# Patient Record
Sex: Female | Born: 1970 | Race: Black or African American | Hispanic: No | Marital: Single | Smoking: Never smoker
Health system: Southern US, Community
[De-identification: ages and names within clinical notes are randomized; demographics above are authoritative.]

## PROBLEM LIST (undated history)

## (undated) DIAGNOSIS — I10 Essential (primary) hypertension: Secondary | ICD-10-CM

## (undated) HISTORY — PX: HERNIA REPAIR: SHX51

## (undated) HISTORY — PX: REDUCTION MAMMAPLASTY: SUR839

## (undated) HISTORY — PX: ABDOMINAL HYSTERECTOMY: SHX81

## (undated) HISTORY — PX: BREAST REDUCTION SURGERY: SHX8

## (undated) HISTORY — PX: UTERINE FIBROID SURGERY: SHX826

---

## 1998-01-21 ENCOUNTER — Emergency Department (HOSPITAL_COMMUNITY): Admission: EM | Admit: 1998-01-21 | Discharge: 1998-01-21 | Payer: Self-pay | Admitting: Emergency Medicine

## 2013-02-19 ENCOUNTER — Ambulatory Visit: Payer: Self-pay | Admitting: Family Medicine

## 2014-10-05 ENCOUNTER — Other Ambulatory Visit: Payer: Self-pay

## 2014-10-05 DIAGNOSIS — Z1231 Encounter for screening mammogram for malignant neoplasm of breast: Secondary | ICD-10-CM

## 2014-10-14 ENCOUNTER — Ambulatory Visit
Admission: RE | Admit: 2014-10-14 | Discharge: 2014-10-14 | Disposition: A | Discharge status: Home / Self Care | Payer: BLUE CROSS/BLUE SHIELD | Source: Ambulatory Visit

## 2014-10-14 ENCOUNTER — Encounter (INDEPENDENT_AMBULATORY_CARE_PROVIDER_SITE_OTHER): Payer: Self-pay

## 2014-10-14 DIAGNOSIS — Z1231 Encounter for screening mammogram for malignant neoplasm of breast: Secondary | ICD-10-CM

## 2014-12-24 ENCOUNTER — Other Ambulatory Visit (HOSPITAL_COMMUNITY): Payer: BLUE CROSS/BLUE SHIELD

## 2014-12-25 ENCOUNTER — Other Ambulatory Visit: Payer: Self-pay | Admitting: Physician Assistant

## 2014-12-25 ENCOUNTER — Encounter (HOSPITAL_COMMUNITY): Payer: Self-pay

## 2014-12-25 ENCOUNTER — Encounter (HOSPITAL_COMMUNITY)
Admission: RE | Admit: 2014-12-25 | Discharge: 2014-12-25 | Disposition: A | Discharge status: Home / Self Care | Payer: BLUE CROSS/BLUE SHIELD | Source: Ambulatory Visit | Attending: Orthopedic Surgery | Admitting: Orthopedic Surgery

## 2014-12-25 DIAGNOSIS — M179 Osteoarthritis of knee, unspecified: Secondary | ICD-10-CM | POA: Insufficient documentation

## 2014-12-25 DIAGNOSIS — Z01810 Encounter for preprocedural cardiovascular examination: Secondary | ICD-10-CM | POA: Insufficient documentation

## 2014-12-25 DIAGNOSIS — Z0183 Encounter for blood typing: Secondary | ICD-10-CM | POA: Diagnosis not present

## 2014-12-25 DIAGNOSIS — Z01812 Encounter for preprocedural laboratory examination: Secondary | ICD-10-CM | POA: Diagnosis present

## 2014-12-25 HISTORY — DX: Essential (primary) hypertension: I10

## 2014-12-25 LAB — CBC WITH DIFFERENTIAL/PLATELET
BASOS ABS: 0.1 10*3/uL (ref 0.0–0.1)
BASOS PCT: 1 % (ref 0–1)
Eosinophils Absolute: 0.1 10*3/uL (ref 0.0–0.7)
Eosinophils Relative: 1 % (ref 0–5)
HCT: 40.7 % (ref 36.0–46.0)
Hemoglobin: 13.4 g/dL (ref 12.0–15.0)
LYMPHS PCT: 37 % (ref 12–46)
Lymphs Abs: 3.6 10*3/uL (ref 0.7–4.0)
MCH: 27.6 pg (ref 26.0–34.0)
MCHC: 32.9 g/dL (ref 30.0–36.0)
MCV: 83.7 fL (ref 78.0–100.0)
MONOS PCT: 8 % (ref 3–12)
Monocytes Absolute: 0.7 10*3/uL (ref 0.1–1.0)
NEUTROS ABS: 5.2 10*3/uL (ref 1.7–7.7)
NEUTROS PCT: 53 % (ref 43–77)
PLATELETS: 282 10*3/uL (ref 150–400)
RBC: 4.86 MIL/uL (ref 3.87–5.11)
RDW: 13.9 % (ref 11.5–15.5)
WBC: 9.7 10*3/uL (ref 4.0–10.5)

## 2014-12-25 LAB — COMPREHENSIVE METABOLIC PANEL
ALT: 15 U/L (ref 14–54)
ANION GAP: 6 (ref 5–15)
AST: 18 U/L (ref 15–41)
Albumin: 3.9 g/dL (ref 3.5–5.0)
Alkaline Phosphatase: 90 U/L (ref 38–126)
BUN: 12 mg/dL (ref 6–20)
CHLORIDE: 109 mmol/L (ref 101–111)
CO2: 25 mmol/L (ref 22–32)
Calcium: 9.5 mg/dL (ref 8.9–10.3)
Creatinine, Ser: 0.81 mg/dL (ref 0.44–1.00)
GFR calc Af Amer: >60 mL/min (ref >60)
Glucose, Bld: 92 mg/dL (ref 65–99)
POTASSIUM: 3.7 mmol/L (ref 3.5–5.1)
SODIUM: 140 mmol/L (ref 135–145)
Total Bilirubin: 0.2 mg/dL — ABNORMAL LOW (ref 0.3–1.2)
Total Protein: 7 g/dL (ref 6.5–8.1)

## 2014-12-25 LAB — ABO/RH: ABO/RH(D): O POS

## 2014-12-25 LAB — PROTIME-INR
INR: 1.12 (ref 0.00–1.49)
PROTHROMBIN TIME: 14.6 s (ref 11.6–15.2)

## 2014-12-25 LAB — TYPE AND SCREEN
ABO/RH(D): O POS
ANTIBODY SCREEN: NEGATIVE

## 2014-12-25 LAB — SURGICAL PCR SCREEN
MRSA, PCR: NEGATIVE
STAPHYLOCOCCUS AUREUS: NEGATIVE

## 2014-12-25 LAB — APTT: APTT: 33 s (ref 24–37)

## 2014-12-25 NOTE — Progress Notes (Signed)
PCP is [employer] [facility]. Patient denied having any acute cardiac or pulmonary issues. Patient informed Nurse that she had hypertension before she had a abdominal hysterectomy, but has not had any issues with BP thereafter.

## 2014-12-25 NOTE — Pre-Procedure Instructions (Signed)
Katherine Harnessiffany Y Gilmore  12/25/2014    Your procedure is scheduled on: Wednesday January 06, 2015 at 10:00 AM.  [facility] at 8:00 A.M.  Call this number if you have problems the morning of surgery: [phone]    Remember:  Do not eat food or drink liquids after midnight.  Take these medicines the morning of surgery with A SIP OF WATER : NONE   Please stop taking any vitamins, herbal medications, Ibuprofen, Advil, Motrin, etc on Wednesday July 13th   Do not wear jewelry, make-up or nail polish.  Do not wear lotions, powders, or perfumes.   Do not shave 48 hours prior to surgery.    Do not bring valuables to the hospital.  [facility] is not responsible for any belongings or valuables.  Contacts, dentures or bridgework may not be worn into surgery.  Leave your suitcase in the car.  After surgery it may be brought to your room.  For patients admitted to the hospital, discharge time will be determined by your treatment team.  Patients discharged the day of surgery will not be allowed to drive home.   Name and phone number of your driver:    Special instructions:  Shower using CHG soap the night before and the morning of your surgery  Please read over the following fact sheets that you were given. Pain Booklet, Coughing and Deep Breathing, Total Joint Packet, MRSA Information and Surgical Site Infection Prevention

## 2014-12-25 NOTE — H&P (Signed)
TOTAL KNEE ADMISSION H&P  Patient is being admitted for left total knee arthroplasty.  Subjective:  Chief Complaint:left knee pain.  HPI: Katherine ARRAZOLA, 44 y.o. female, has a history of pain and functional disability in the left knee due to arthritis and has failed non-surgical conservative treatments for greater than 12 weeks to includeNSAID's and/or analgesics, corticosteriod injections, viscosupplementation injections and activity modification.  Onset of symptoms was gradual, starting 3 years ago with rapidlly worsening course since that time. The patient noted no past surgery on the left knee(s).  Patient currently rates pain in the left knee(s) at 7 out of 10 with activity. Patient has night pain, worsening of pain with activity and weight bearing, pain that interferes with activities of daily living and crepitus.  Patient has evidence of subchondral sclerosis and joint space narrowing by imaging studies. There is no active infection.  There are no active problems to display for this patient.  No past medical history on file.  No past surgical history on file.   (Not in a hospital admission) Allergies not on file  History  Substance Use Topics  . Smoking status: Not on file  . Smokeless tobacco: Not on file  . Alcohol Use: Not on file    No family history on file.   Review of Systems  Constitutional: Negative.   HENT: Negative.   Eyes: Negative.   Respiratory: Negative.   Cardiovascular: Negative.   Gastrointestinal: Negative.   Genitourinary: Negative.   Musculoskeletal: Positive for joint pain.  Skin: Negative.   Neurological: Negative.   Endo/Heme/Allergies: Negative.   Psychiatric/Behavioral: Negative.     Objective:  Physical Exam  Constitutional: She is oriented to person, place, and time. She appears well-developed and well-nourished.  HENT:  Head: Normocephalic and atraumatic.  Eyes: EOM are normal. Pupils are equal, round, and reactive to light.  Neck:  Normal range of motion. Neck supple.  Cardiovascular: Normal rate and regular rhythm.  Exam reveals no gallop and no friction rub.   No murmur heard. Respiratory: Effort normal and breath sounds normal. No respiratory distress. She has no wheezes. She has no rales.  GI: Soft. Bowel sounds are normal.  Musculoskeletal:  Examination of patient's bilateral knees reveal significant lateral joint line tenderness on both the left and right knee, worse on the left.  1+ effusion bilaterally.  Severe knee valgus with standing.  Severe foot pronation.  Retropatellar grind is 2+ bilaterally.  Ligamentous structures are intact.  Negative McMurray's and Thessaly bilaterally.  Patient's gait is significantly abnormal with significant pronation, valgus stress on the knees, and internal rotation of the hips.  Neurovascularly intact distally.   Neurological: She is alert and oriented to person, place, and time.  Skin: Skin is warm and dry.  Psychiatric: She has a normal mood and affect. Her behavior is normal. Judgment and thought content normal.    Vital signs in last 24 hours: @  Labs:   There is no height or weight on file to calculate BMI.   Imaging Review Plain radiographs demonstrate severe degenerative joint disease of the left knee(s). The overall alignment ismild valgus. The bone quality appears to be fair for age and reported activity level.  Assessment/Plan:  End stage arthritis, left knee   The patient history, physical examination, clinical judgment of the provider and imaging studies are consistent with end stage degenerative joint disease of the left knee(s) and total knee arthroplasty is deemed medically necessary. The treatment options including medical management, injection therapy  arthroscopy and arthroplasty were discussed at length. The risks and benefits of total knee arthroplasty were presented and reviewed. The risks due to aseptic loosening, infection, stiffness, patella  tracking problems, thromboembolic complications and other imponderables were discussed. The patient acknowledged the explanation, agreed to proceed with the plan and consent was signed. Patient is being admitted for inpatient treatment for surgery, pain control, PT, OT, prophylactic antibiotics, VTE prophylaxis, progressive ambulation and ADL's and discharge planning. The patient is planning to be discharged home with home health services

## 2014-12-27 LAB — URINE CULTURE

## 2015-01-05 MED ORDER — CEFAZOLIN SODIUM-DEXTROSE 2-3 GM-% IV SOLR
2.0000 g | INTRAVENOUS | Status: AC
  Administered 2015-01-06: 2 g via INTRAVENOUS
  Filled 2015-01-05: qty 50

## 2015-01-05 MED ORDER — LACTATED RINGERS IV SOLN: INTRAVENOUS | Status: DC

## 2015-01-05 NOTE — Anesthesia Preprocedure Evaluation (Addendum)
Anesthesia Evaluation  Patient identified by MRN, date of birth, ID band Patient awake    Reviewed: Allergy & Precautions, NPO status , Patient's Chart, lab work & pertinent test results, reviewed documented beta blocker date and time   Airway Mallampati: I  TM Distance: >3 FB Neck ROM: Full    Dental  (+) Teeth Intact, Dental Advisory Given   Pulmonary neg pulmonary ROS,  breath sounds clear to auscultation        Cardiovascular hypertension, Pt. on medications Rhythm:Regular  EKG 12/2014 WNL   Neuro/Psych negative neurological ROS     GI/Hepatic negative GI ROS, Neg liver ROS,   Endo/Other  negative endocrine ROS  Renal/GU negative Renal ROS     Musculoskeletal  (+) Arthritis -,   Abdominal (+)  Abdomen: soft. Bowel sounds: normal.  Peds  Hematology 13/40   Anesthesia Other Findings   Reproductive/Obstetrics                       Anesthesia Physical Anesthesia Plan  ASA: II  Anesthesia Plan: General   Post-op Pain Management:    Induction: Intravenous  Airway Management Planned: Oral ETT  Additional Equipment:   Intra-op Plan:   Post-operative Plan: Extubation in OR  Informed Consent: I have reviewed the patients History and Physical, chart, labs and discussed the procedure including the risks, benefits and alternatives for the proposed anesthesia with the patient or authorized representative who has indicated his/her understanding and acceptance.     Plan Discussed with:   Anesthesia Plan Comments: (Will offer spinal, GA if spinal refused)      Anesthesia Quick Evaluation

## 2015-01-06 ENCOUNTER — Inpatient Hospital Stay (HOSPITAL_COMMUNITY): Payer: BLUE CROSS/BLUE SHIELD | Admitting: Anesthesiology

## 2015-01-06 ENCOUNTER — Encounter (HOSPITAL_COMMUNITY)
Admission: RE | Disposition: A | Discharge status: Home Health | Payer: Self-pay | Source: Ambulatory Visit | Attending: Orthopedic Surgery

## 2015-01-06 ENCOUNTER — Inpatient Hospital Stay (HOSPITAL_COMMUNITY)
Admission: RE | Admit: 2015-01-06 | Discharge: 2015-01-07 | DRG: 470 | Disposition: A | Discharge status: Home Health | Payer: BLUE CROSS/BLUE SHIELD | Source: Ambulatory Visit | Attending: Orthopedic Surgery | Admitting: Orthopedic Surgery

## 2015-01-06 ENCOUNTER — Encounter (HOSPITAL_COMMUNITY): Payer: Self-pay | Admitting: *Deleted

## 2015-01-06 ENCOUNTER — Inpatient Hospital Stay (HOSPITAL_COMMUNITY): Payer: BLUE CROSS/BLUE SHIELD

## 2015-01-06 DIAGNOSIS — Z96659 Presence of unspecified artificial knee joint: Secondary | ICD-10-CM

## 2015-01-06 DIAGNOSIS — M1712 Unilateral primary osteoarthritis, left knee: Secondary | ICD-10-CM | POA: Diagnosis present

## 2015-01-06 DIAGNOSIS — I10 Essential (primary) hypertension: Secondary | ICD-10-CM | POA: Diagnosis present

## 2015-01-06 DIAGNOSIS — M179 Osteoarthritis of knee, unspecified: Secondary | ICD-10-CM | POA: Diagnosis present

## 2015-01-06 DIAGNOSIS — M1710 Unilateral primary osteoarthritis, unspecified knee: Secondary | ICD-10-CM | POA: Diagnosis present

## 2015-01-06 DIAGNOSIS — M25562 Pain in left knee: Secondary | ICD-10-CM | POA: Diagnosis present

## 2015-01-06 HISTORY — PX: TOTAL KNEE ARTHROPLASTY: SHX125

## 2015-01-06 SURGERY — ARTHROPLASTY, KNEE, TOTAL
Anesthesia: General | Site: Knee | Laterality: Left

## 2015-01-06 MED ORDER — CHLORHEXIDINE GLUCONATE 4 % EX LIQD
60.0000 mL | Freq: Once | CUTANEOUS | Status: DC
Start: 2015-01-06 — End: 2015-01-06

## 2015-01-06 MED ORDER — PHENOL 1.4 % MT LIQD: 1.0000 | OROMUCOSAL | Status: DC | PRN

## 2015-01-06 MED ORDER — ACETAMINOPHEN 650 MG RE SUPP: 650.0000 mg | Freq: Four times a day (QID) | RECTAL | Status: DC | PRN

## 2015-01-06 MED ORDER — BUPIVACAINE LIPOSOME 1.3 % IJ SUSP
20.0000 mL | INTRAMUSCULAR | Status: AC
  Administered 2015-01-06: 20 mL
  Filled 2015-01-06: qty 20

## 2015-01-06 MED ORDER — METHOCARBAMOL 500 MG PO TABS: 500.0000 mg | ORAL_TABLET | Freq: Four times a day (QID) | ORAL | Status: DC

## 2015-01-06 MED ORDER — FENTANYL CITRATE (PF) 250 MCG/5ML IJ SOLN
INTRAMUSCULAR | Status: AC
  Filled 2015-01-06: qty 5

## 2015-01-06 MED ORDER — SODIUM CHLORIDE 0.9 % IR SOLN
Status: DC | PRN
  Administered 2015-01-06 (×2): 1000 mL

## 2015-01-06 MED ORDER — ACETAMINOPHEN 325 MG PO TABS: 650.0000 mg | ORAL_TABLET | Freq: Four times a day (QID) | ORAL | Status: DC | PRN

## 2015-01-06 MED ORDER — MAGNESIUM OXIDE 400 (241.3 MG) MG PO TABS
400.0000 mg | ORAL_TABLET | Freq: Every day | ORAL | Status: DC
  Administered 2015-01-06: 400 mg via ORAL
  Filled 2015-01-06: qty 1

## 2015-01-06 MED ORDER — ACETAMINOPHEN 10 MG/ML IV SOLN
INTRAVENOUS | Status: AC
  Filled 2015-01-06: qty 100

## 2015-01-06 MED ORDER — LIDOCAINE HCL (CARDIAC) 20 MG/ML IV SOLN
INTRAVENOUS | Status: DC | PRN
  Administered 2015-01-06: 80 mg via INTRAVENOUS

## 2015-01-06 MED ORDER — ACETAMINOPHEN 10 MG/ML IV SOLN
INTRAVENOUS | Status: DC | PRN
  Administered 2015-01-06: 1000 mg via INTRAVENOUS

## 2015-01-06 MED ORDER — GLYCOPYRROLATE 0.2 MG/ML IJ SOLN
INTRAMUSCULAR | Status: DC | PRN
  Administered 2015-01-06: 0.6 mg via INTRAVENOUS

## 2015-01-06 MED ORDER — ONDANSETRON HCL 4 MG/2ML IJ SOLN
INTRAMUSCULAR | Status: DC | PRN
  Administered 2015-01-06: 4 mg via INTRAVENOUS

## 2015-01-06 MED ORDER — ROCURONIUM BROMIDE 100 MG/10ML IV SOLN
INTRAVENOUS | Status: DC | PRN
  Administered 2015-01-06: 10 mg via INTRAVENOUS
  Administered 2015-01-06: 40 mg via INTRAVENOUS

## 2015-01-06 MED ORDER — PROMETHAZINE HCL 25 MG/ML IJ SOLN: 6.2500 mg | INTRAMUSCULAR | Status: DC | PRN

## 2015-01-06 MED ORDER — HYDROMORPHONE HCL 1 MG/ML IJ SOLN
INTRAMUSCULAR | Status: AC
  Filled 2015-01-06: qty 1

## 2015-01-06 MED ORDER — HYDROMORPHONE HCL 1 MG/ML IJ SOLN
0.2500 mg | INTRAMUSCULAR | Status: DC | PRN
  Administered 2015-01-06 (×4): 0.5 mg via INTRAVENOUS

## 2015-01-06 MED ORDER — OXYCODONE HCL 5 MG PO TABS
5.0000 mg | ORAL_TABLET | ORAL | Status: DC | PRN
  Administered 2015-01-06 (×2): 10 mg via ORAL
  Administered 2015-01-06: 5 mg via ORAL
  Administered 2015-01-07 (×3): 10 mg via ORAL
  Filled 2015-01-06 (×5): qty 2

## 2015-01-06 MED ORDER — CEFAZOLIN SODIUM-DEXTROSE 2-3 GM-% IV SOLR
2.0000 g | Freq: Four times a day (QID) | INTRAVENOUS | Status: AC
  Administered 2015-01-06 (×2): 2 g via INTRAVENOUS
  Filled 2015-01-06 (×2): qty 50

## 2015-01-06 MED ORDER — APIXABAN 2.5 MG PO TABS
2.5000 mg | ORAL_TABLET | Freq: Two times a day (BID) | ORAL | Status: DC
  Administered 2015-01-07: 2.5 mg via ORAL
  Filled 2015-01-06: qty 1

## 2015-01-06 MED ORDER — ONDANSETRON HCL 4 MG PO TABS: 4.0000 mg | ORAL_TABLET | Freq: Four times a day (QID) | ORAL | Status: DC | PRN

## 2015-01-06 MED ORDER — ROCURONIUM BROMIDE 50 MG/5ML IV SOLN
INTRAVENOUS | Status: AC
  Filled 2015-01-06: qty 1

## 2015-01-06 MED ORDER — LACTATED RINGERS IV SOLN
INTRAVENOUS | Status: DC
  Administered 2015-01-06 (×3): via INTRAVENOUS

## 2015-01-06 MED ORDER — ONDANSETRON HCL 4 MG/2ML IJ SOLN
4.0000 mg | Freq: Four times a day (QID) | INTRAMUSCULAR | Status: DC | PRN
  Administered 2015-01-06: 4 mg via INTRAVENOUS
  Filled 2015-01-06: qty 2

## 2015-01-06 MED ORDER — APIXABAN 2.5 MG PO TABS
ORAL_TABLET | ORAL | Status: DC
Start: 2015-01-06 — End: 2015-03-10

## 2015-01-06 MED ORDER — FENTANYL CITRATE (PF) 100 MCG/2ML IJ SOLN
INTRAMUSCULAR | Status: DC | PRN
  Administered 2015-01-06: 50 ug via INTRAVENOUS
  Administered 2015-01-06: 100 ug via INTRAVENOUS
  Administered 2015-01-06 (×6): 50 ug via INTRAVENOUS

## 2015-01-06 MED ORDER — ALUM & MAG HYDROXIDE-SIMETH 200-200-20 MG/5ML PO SUSP: 30.0000 mL | ORAL | Status: DC | PRN

## 2015-01-06 MED ORDER — MIDAZOLAM HCL 5 MG/5ML IJ SOLN
INTRAMUSCULAR | Status: DC | PRN
  Administered 2015-01-06 (×2): 1 mg via INTRAVENOUS

## 2015-01-06 MED ORDER — BISACODYL 5 MG PO TBEC: 5.0000 mg | DELAYED_RELEASE_TABLET | Freq: Every day | ORAL | Status: DC | PRN

## 2015-01-06 MED ORDER — MIDAZOLAM HCL 2 MG/2ML IJ SOLN
INTRAMUSCULAR | Status: AC
  Filled 2015-01-06: qty 2

## 2015-01-06 MED ORDER — HYDROMORPHONE HCL 1 MG/ML IJ SOLN
0.5000 mg | INTRAMUSCULAR | Status: DC | PRN
  Administered 2015-01-06 – 2015-01-07 (×3): 1 mg via INTRAVENOUS
  Filled 2015-01-06 (×4): qty 1

## 2015-01-06 MED ORDER — CELECOXIB 200 MG PO CAPS
200.0000 mg | ORAL_CAPSULE | Freq: Two times a day (BID) | ORAL | Status: DC
  Administered 2015-01-06 – 2015-01-07 (×2): 200 mg via ORAL
  Filled 2015-01-06 (×2): qty 1

## 2015-01-06 MED ORDER — MAGNESIUM 500 MG PO TABS: 500.0000 mg | ORAL_TABLET | Freq: Every day | ORAL | Status: DC

## 2015-01-06 MED ORDER — PROPOFOL 10 MG/ML IV BOLUS
INTRAVENOUS | Status: DC | PRN
  Administered 2015-01-06: 200 mg via INTRAVENOUS

## 2015-01-06 MED ORDER — OXYCODONE-ACETAMINOPHEN 5-325 MG PO TABS: 1.0000 | ORAL_TABLET | ORAL | Status: DC | PRN

## 2015-01-06 MED ORDER — MAGNESIUM CITRATE PO SOLN: 1.0000 | Freq: Once | ORAL | Status: AC | PRN

## 2015-01-06 MED ORDER — DEXAMETHASONE SODIUM PHOSPHATE 10 MG/ML IJ SOLN
INTRAMUSCULAR | Status: DC | PRN
  Administered 2015-01-06: 8 mg via INTRAVENOUS

## 2015-01-06 MED ORDER — MENTHOL 3 MG MT LOZG: 1.0000 | LOZENGE | OROMUCOSAL | Status: DC | PRN

## 2015-01-06 MED ORDER — BUPIVACAINE HCL 0.5 % IJ SOLN
INTRAMUSCULAR | Status: DC | PRN
  Administered 2015-01-06: 20 mL

## 2015-01-06 MED ORDER — 0.9 % SODIUM CHLORIDE (POUR BTL) OPTIME
TOPICAL | Status: DC | PRN
  Administered 2015-01-06: 1000 mL

## 2015-01-06 MED ORDER — CHLORHEXIDINE GLUCONATE 4 % EX LIQD: 60.0000 mL | Freq: Once | CUTANEOUS | Status: DC

## 2015-01-06 MED ORDER — NEOSTIGMINE METHYLSULFATE 10 MG/10ML IV SOLN
INTRAVENOUS | Status: DC | PRN
  Administered 2015-01-06: 4 mg via INTRAVENOUS

## 2015-01-06 MED ORDER — DOCUSATE SODIUM 100 MG PO CAPS
100.0000 mg | ORAL_CAPSULE | Freq: Two times a day (BID) | ORAL | Status: DC
Start: 2015-01-06 — End: 2015-01-07
  Administered 2015-01-06 – 2015-01-07 (×2): 100 mg via ORAL
  Filled 2015-01-06 (×2): qty 1

## 2015-01-06 MED ORDER — METOCLOPRAMIDE HCL 5 MG PO TABS: 5.0000 mg | ORAL_TABLET | Freq: Three times a day (TID) | ORAL | Status: DC | PRN

## 2015-01-06 MED ORDER — DIPHENHYDRAMINE HCL 12.5 MG/5ML PO ELIX: 12.5000 mg | ORAL_SOLUTION | ORAL | Status: DC | PRN

## 2015-01-06 MED ORDER — METHOCARBAMOL 1000 MG/10ML IJ SOLN
500.0000 mg | Freq: Four times a day (QID) | INTRAMUSCULAR | Status: DC | PRN
  Administered 2015-01-06: 500 mg via INTRAVENOUS
  Filled 2015-01-06 (×4): qty 5

## 2015-01-06 MED ORDER — PHENYLEPHRINE 40 MCG/ML (10ML) SYRINGE FOR IV PUSH (FOR BLOOD PRESSURE SUPPORT)
PREFILLED_SYRINGE | INTRAVENOUS | Status: AC
  Filled 2015-01-06: qty 10

## 2015-01-06 MED ORDER — LIDOCAINE HCL (CARDIAC) 20 MG/ML IV SOLN
INTRAVENOUS | Status: AC
  Filled 2015-01-06: qty 5

## 2015-01-06 MED ORDER — MEPERIDINE HCL 25 MG/ML IJ SOLN: 6.2500 mg | INTRAMUSCULAR | Status: DC | PRN

## 2015-01-06 MED ORDER — DEXAMETHASONE SODIUM PHOSPHATE 10 MG/ML IJ SOLN
10.0000 mg | Freq: Once | INTRAMUSCULAR | Status: AC
Start: 2015-01-07 — End: 2015-01-07
  Administered 2015-01-07: 10 mg via INTRAVENOUS
  Filled 2015-01-06: qty 1

## 2015-01-06 MED ORDER — METOCLOPRAMIDE HCL 5 MG/ML IJ SOLN: 5.0000 mg | Freq: Three times a day (TID) | INTRAMUSCULAR | Status: DC | PRN

## 2015-01-06 MED ORDER — PROPOFOL 10 MG/ML IV BOLUS
INTRAVENOUS | Status: AC
  Filled 2015-01-06: qty 20

## 2015-01-06 MED ORDER — OXYCODONE HCL 5 MG PO TABS
ORAL_TABLET | ORAL | Status: AC
  Filled 2015-01-06: qty 1

## 2015-01-06 MED ORDER — ONDANSETRON HCL 4 MG PO TABS: 4.0000 mg | ORAL_TABLET | Freq: Three times a day (TID) | ORAL | Status: DC | PRN

## 2015-01-06 MED ORDER — SENNOSIDES-DOCUSATE SODIUM 8.6-50 MG PO TABS
1.0000 | ORAL_TABLET | Freq: Every evening | ORAL | Status: DC | PRN
Start: 2015-01-06 — End: 2015-01-07

## 2015-01-06 MED ORDER — METHOCARBAMOL 500 MG PO TABS
500.0000 mg | ORAL_TABLET | Freq: Four times a day (QID) | ORAL | Status: DC | PRN
  Administered 2015-01-06 – 2015-01-07 (×3): 500 mg via ORAL
  Filled 2015-01-06 (×4): qty 1

## 2015-01-06 MED ORDER — POTASSIUM CHLORIDE IN NACL 20-0.9 MEQ/L-% IV SOLN
INTRAVENOUS | Status: DC
Start: 2015-01-06 — End: 2015-01-07
  Administered 2015-01-06: 17:00:00 via INTRAVENOUS
  Filled 2015-01-06 (×3): qty 1000

## 2015-01-06 SURGICAL SUPPLY — 63 items
BANDAGE ELASTIC 4 VELCRO ST LF (GAUZE/BANDAGES/DRESSINGS) ×3 IMPLANT
BANDAGE ELASTIC 6 VELCRO ST LF (GAUZE/BANDAGES/DRESSINGS) ×3 IMPLANT
BANDAGE ESMARK 6X9 LF (GAUZE/BANDAGES/DRESSINGS) ×1 IMPLANT
BENZOIN TINCTURE PRP APPL 2/3 (GAUZE/BANDAGES/DRESSINGS) ×3 IMPLANT
BLADE SAG 18X100X1.27 (BLADE) ×6 IMPLANT
BNDG ESMARK 6X9 LF (GAUZE/BANDAGES/DRESSINGS) ×3
BOWL SMART MIX CTS (DISPOSABLE) ×3 IMPLANT
CAPT KNEE TOTAL 3 ×3 IMPLANT
CEMENT BONE SIMPLEX SPEEDSET (Cement) ×6 IMPLANT
CLOSURE WOUND 1/2 X4 (GAUZE/BANDAGES/DRESSINGS) ×1
COVER SURGICAL LIGHT HANDLE (MISCELLANEOUS) ×3 IMPLANT
CUFF TOURNIQUET SINGLE 34IN LL (TOURNIQUET CUFF) IMPLANT
CUFF TOURNIQUET SINGLE 44IN (TOURNIQUET CUFF) ×3 IMPLANT
DRAPE EXTREMITY T 121X128X90 (DRAPE) ×3 IMPLANT
DRAPE IMP U-DRAPE 54X76 (DRAPES) ×3 IMPLANT
DRAPE PROXIMA HALF (DRAPES) ×3 IMPLANT
DRAPE U-SHAPE 47X51 STRL (DRAPES) ×3 IMPLANT
DRSG PAD ABDOMINAL 8X10 ST (GAUZE/BANDAGES/DRESSINGS) ×3 IMPLANT
DURAPREP 26ML APPLICATOR (WOUND CARE) ×6 IMPLANT
ELECT CAUTERY BLADE 6.4 (BLADE) ×3 IMPLANT
ELECT REM PT RETURN 9FT ADLT (ELECTROSURGICAL) ×3
ELECTRODE REM PT RTRN 9FT ADLT (ELECTROSURGICAL) ×1 IMPLANT
EVACUATOR 1/8 PVC DRAIN (DRAIN) ×3 IMPLANT
FACESHIELD WRAPAROUND (MASK) ×6 IMPLANT
GAUZE SPONGE 4X4 12PLY STRL (GAUZE/BANDAGES/DRESSINGS) ×3 IMPLANT
GLOVE BIOGEL PI IND STRL 7.0 (GLOVE) ×1 IMPLANT
GLOVE BIOGEL PI INDICATOR 7.0 (GLOVE) ×2
GLOVE ORTHO TXT STRL SZ7.5 (GLOVE) ×3 IMPLANT
GLOVE SURG ORTHO 7.0 STRL STRW (GLOVE) ×3 IMPLANT
GOWN STRL REUS W/ TWL LRG LVL3 (GOWN DISPOSABLE) ×3 IMPLANT
GOWN STRL REUS W/ TWL XL LVL3 (GOWN DISPOSABLE) ×1 IMPLANT
GOWN STRL REUS W/TWL LRG LVL3 (GOWN DISPOSABLE) ×6
GOWN STRL REUS W/TWL XL LVL3 (GOWN DISPOSABLE) ×2
HANDPIECE INTERPULSE COAX TIP (DISPOSABLE) ×2
IMMOBILIZER KNEE 22 UNIV (SOFTGOODS) ×3 IMPLANT
IMMOBILIZER KNEE 24 THIGH 36 (MISCELLANEOUS) IMPLANT
IMMOBILIZER KNEE 24 UNIV (MISCELLANEOUS)
KIT BASIN OR (CUSTOM PROCEDURE TRAY) ×3 IMPLANT
KIT ROOM TURNOVER OR (KITS) ×3 IMPLANT
MANIFOLD NEPTUNE II (INSTRUMENTS) ×3 IMPLANT
NEEDLE 18GX1X1/2 (RX/OR ONLY) (NEEDLE) ×3 IMPLANT
NEEDLE HYPO 25GX1X1/2 BEV (NEEDLE) ×3 IMPLANT
NS IRRIG 1000ML POUR BTL (IV SOLUTION) ×3 IMPLANT
PACK TOTAL JOINT (CUSTOM PROCEDURE TRAY) ×3 IMPLANT
PACK UNIVERSAL I (CUSTOM PROCEDURE TRAY) ×3 IMPLANT
PAD ARMBOARD 7.5X6 YLW CONV (MISCELLANEOUS) ×6 IMPLANT
PAD CAST 4YDX4 CTTN HI CHSV (CAST SUPPLIES) ×1 IMPLANT
PADDING CAST COTTON 4X4 STRL (CAST SUPPLIES) ×2
PADDING CAST COTTON 6X4 STRL (CAST SUPPLIES) IMPLANT
SET HNDPC FAN SPRY TIP SCT (DISPOSABLE) ×1 IMPLANT
STRIP CLOSURE SKIN 1/2X4 (GAUZE/BANDAGES/DRESSINGS) ×2 IMPLANT
SUCTION FRAZIER TIP 10 FR DISP (SUCTIONS) ×3 IMPLANT
SUT MNCRL AB 4-0 PS2 18 (SUTURE) ×3 IMPLANT
SUT VIC AB 0 CT1 27 (SUTURE) ×4
SUT VIC AB 0 CT1 27XBRD ANBCTR (SUTURE) ×2 IMPLANT
SUT VIC AB 1 CTX 36 (SUTURE) ×4
SUT VIC AB 1 CTX36XBRD ANBCTR (SUTURE) ×2 IMPLANT
SUT VIC AB 2-0 CT1 27 (SUTURE) ×6
SUT VIC AB 2-0 CT1 TAPERPNT 27 (SUTURE) ×3 IMPLANT
SYR 50ML LL SCALE MARK (SYRINGE) ×3 IMPLANT
SYR CONTROL 10ML LL (SYRINGE) ×3 IMPLANT
TOWEL OR 17X24 6PK STRL BLUE (TOWEL DISPOSABLE) ×3 IMPLANT
TOWEL OR 17X26 10 PK STRL BLUE (TOWEL DISPOSABLE) ×3 IMPLANT

## 2015-01-06 NOTE — Transfer of Care (Signed)
Immediate Anesthesia Transfer of Care Note  Patient: Katherine Gilmore  Procedure(s) Performed: Procedure(s): TOTAL KNEE ARTHROPLASTY (Left)  Patient Location: PACU  Anesthesia Type:General  Level of Consciousness: awake, alert  and sedated  Airway & Oxygen Therapy: Patient connected to face mask oxygen  Post-op Assessment: Report given to RN  Post vital signs: stable  Last Vitals:  Filed Vitals:   01/06/15 0827  BP: 163/90  Pulse: 82  Temp: 36.4 C  Resp: 18    Complications: No apparent anesthesia complications

## 2015-01-06 NOTE — Op Note (Signed)
NAMCarney Bern:  Quigg, Gurtha              ACCOUNT NO.:  [id number]  MEDICAL RECORD NO.:  [id number]  LOCATION:  5N11C                        FACILITY:  [facility]  PHYSICIAN:  Loreta Aveaniel F. Murphy, M.D. DATE OF BIRTH:  1971-03-01  DATE OF PROCEDURE:  01/06/2015 DATE OF DISCHARGE:                              OPERATIVE REPORT   PREOPERATIVE DIAGNOSES:  End-stage arthritis, primary localized left knee.  Marked valgus with bone loss, lateral compartment and primarily in the femur.  PREOPERATIVE DIAGNOSES:  End-stage arthritis, primary localized left knee.  Marked valgus with bone loss, lateral compartment and primarily in the femur.  PROCEDURE:  Left knee modified minimally invasive total knee replacement with Stryker triathlon prosthesis.  Soft tissue balancing.  Cemented, pegged cruciate retaining #4 femoral component.  Cemented #4 tibial component, 9-mm CS insert.  Resurfacing of patella with cemented pegged medial offset, 32-mm patellar component.  SURGEON:  Loreta Aveaniel F. Murphy, M.D.  ASSISTANT:  Mikey KirschnerLindsey Stanberry, PA, present throughout the entire case and necessary for timely completion of procedure.  ANESTHESIA:  General.  BLOOD LOSS:  Minimal.  SPECIMENS:  None.  CULTURES:  None.  COMPLICATIONS:  None.  DRESSINGS:  Soft compressive knee immobilizer.  TOURNIQUET TIME:  45 minutes.  DRAINS:  Hemovac x1.  DESCRIPTION OF PROCEDURE:  The patient was brought to the operating room and placed on the operating table in supine position.  After adequate anesthesia had been obtained, tourniquet applied, prepped and draped in usual sterile fashion.  Exsanguinated with elevation of Esmarch. Tourniquet was inflated to 350 mmHg.  Straight incision above the patella, down to tibial tubercle.  Skin and subcutaneous tissue divided. Medial arthrotomy, vastus splitting, preserving quad tendon.  More than 12 degrees of varus slightly correctable.  Intramedullary guide, distal femur.  An 8-mm  resection 5 degrees of valgus.  Using epicondylar axis, the femur was sized, cut, and fitted for a pegged #4 cruciate retaining component.  With these cuts, I could make up for most of her bone loss and I did not have to add any augments.  Proximal and tibial resection with extramedullary guide.  Posterior 10-mm removed off the patellar, drill sized and fitted for 32-mm component.  Trials were put in place. With the #4 components above and below, 9-mm CS insert and a 32 patella, I was very pleased with balancing, full motion, alignment, stability and correction.  I did not do any more significant releases laterally. Tibia was marked for rotation and hand ream.  All trials were removed. Copious irrigation with pulse irrigating device.  Cement prepared, placed on all components, firmly seated.  Polyethylene was attached to the tibia, knee reduced.  Patella held with clamp.  Once cement hardened, the knee was irrigated again.  Soft tissue was injected with Exparel.  Hemovac placed through a separate stab wound.  Arthrotomy was closed with Ethibond.  Subcutaneous and subcuticular closure.  Margins were injected with Marcaine.  Sterile compressive dressing applied. Anesthesia reversed.  Brought to the recovery room.  Tolerated the surgery well.  No complications.     Loreta Aveaniel F. Murphy, M.D.     DFM/MEDQ  D:  01/06/2015  T:  01/06/2015  Job:  [id number]

## 2015-01-06 NOTE — Anesthesia Procedure Notes (Signed)
Procedure Name: Intubation Date/Time: 01/06/2015 9:52 AM Performed by: Dorie RankQUINN, Suzzette Gasparro M Pre-anesthesia Checklist: Patient identified, Emergency Drugs available, Suction available, Patient being monitored and Timeout performed Patient Re-evaluated:Patient Re-evaluated prior to inductionOxygen Delivery Method: Circle system utilized Preoxygenation: Pre-oxygenation with 100% oxygen Intubation Type: IV induction Ventilation: Mask ventilation without difficulty Laryngoscope Size: Mac and 3 Grade View: Grade I Tube type: Oral Tube size: 7.5 mm Number of attempts: 1 Airway Equipment and Method: Stylet Placement Confirmation: ETT inserted through vocal cords under direct vision,  positive ETCO2 and breath sounds checked- equal and bilateral Secured at: 22 cm Tube secured with: Tape Dental Injury: Teeth and Oropharynx as per pre-operative assessment  Comments: Atraumatic induction and intubation with MAC 3 blade.  Dr. Berneice HeinrichManny verified placement.  Zigmund GottronH Deysy Schabel, CRNA

## 2015-01-06 NOTE — Evaluation (Signed)
Physical Therapy Evaluation Patient Details Name: Katherine Gilmore MRN: [id number] DOB: 17-Aug-1970 Today's Date: 01/06/2015   History of Present Illness  44 y.o. female s/p left total knee arthroplasty.  Clinical Impression  Pt is s/p left TKA resulting in the deficits listed below (see PT Problem List). Tolerated bed mobility, therapeutic exercises, and gait training today. Ambulatory distance limited by onset of nausea and lightheadedness which improved slowly after returning to supine position in bed. Mild knee instability noted in Lt stance phase, using a knee immobilizer to ambulate post op day #0 with min assist. Reviewed knee precautions and positioning of knee in "bone foam" for optimal healing. Has 10 steps to climb in order to enter home. Family can provide 24 hour supervision as needed. Pt will benefit from skilled PT to increase their independence and safety with mobility to allow discharge to the venue listed below.      Follow Up Recommendations Home health PT;Supervision for mobility/OOB    Equipment Recommendations  None recommended by PT    Recommendations for Other Services       Precautions / Restrictions Precautions Precautions: Knee Precaution Booklet Issued: Yes (comment) Precaution Comments: Reviewed precautions Required Braces or Orthoses:  (KI in room no order noted) Restrictions Weight Bearing Restrictions: Yes LLE Weight Bearing: Weight bearing as tolerated      Mobility  Bed Mobility Overal bed mobility: Needs Assistance Bed Mobility: Supine to Sit;Sit to Supine     Supine to sit: Supervision;HOB elevated Sit to supine: Min assist   General bed mobility comments: Supervision with VC for techniques with transition from supine to sit with HOB elevated. Min assist for LLE support back into bed.  Transfers Overall transfer level: Needs assistance Equipment used: Rolling walker (2 wheeled) Transfers: Sit to/from Stand Sit to Stand: Min guard          General transfer comment: Min guard for safety. VC for hand placement. Encouraged increased WB through LLE upon standing.  Ambulation/Gait Ambulation/Gait assistance: Min assist Ambulation Distance (Feet): 15 Feet Assistive device: Rolling walker (2 wheeled) Gait Pattern/deviations: Step-to pattern;Decreased step length - right;Decreased stance time - left;Antalgic;Trunk flexed Gait velocity: slow Gait velocity interpretation: Below normal speed for age/gender General Gait Details: Pre-gait training with weight shifting activity in standing, focusing on Lt knee extension for quad activation. Educated on safe DME use with VC for sequencing with RW. Min assist for Lt knee block with knee immobilizer in place. Mild instability noted. Pt began to feel nauseous with tinnitus complaint. Improved slowly after sitting and returning to supine.  Stairs            Wheelchair Mobility    Modified Rankin (Stroke Patients Only)       Balance Overall balance assessment: Needs assistance Sitting-balance support: No upper extremity supported;Feet supported Sitting balance-Leahy Scale: Good     Standing balance support: No upper extremity supported Standing balance-Leahy Scale: Fair                               Pertinent Vitals/Pain Pain Assessment: 0-10 Pain Score: 7  Pain Location: Lt knee Pain Descriptors / Indicators: Throbbing Pain Intervention(s): Monitored during session;Repositioned    Home Living Family/patient expects to be discharged to:: Private residence Living Arrangements: Children Available Help at Discharge: Family;Available 24 hours/day (Mother staying with pt at D/c) Type of Home: Apartment (Condo) Home Access: Stairs to enter Entrance Stairs-Rails: Doctor, general practice of Steps: 10  Home Layout: One level Home Equipment: Walker - 2 wheels;Bedside commode;Shower seat - built in      Prior Function Level of Independence:  Independent               Hand Dominance   Dominant Hand: Right    Extremity/Trunk Assessment   Upper Extremity Assessment: Defer to OT evaluation           Lower Extremity Assessment: LLE deficits/detail   LLE Deficits / Details: decreased strength and ROM as expected post-op. Able to move toes volitionally. Normal light touch sensation     Communication   Communication: No difficulties  Cognition Arousal/Alertness: Awake/alert Behavior During Therapy: WFL for tasks assessed/performed Overall Cognitive Status: Within Functional Limits for tasks assessed                      General Comments General comments (skin integrity, edema, etc.): Limited ambulation due to nausea and feeling lightheaded. RN notified. Returned to supine.    Exercises Total Joint Exercises Ankle Circles/Pumps: AROM;Both;10 reps;Supine Quad Sets: Strengthening;10 reps;Left;Supine      Assessment/Plan    PT Assessment Patient needs continued PT services  PT Diagnosis Difficulty walking;Abnormality of gait;Acute pain   PT Problem List Decreased strength;Decreased range of motion;Decreased activity tolerance;Decreased balance;Decreased mobility;Decreased knowledge of use of DME;Decreased knowledge of precautions;Obesity;Pain  PT Treatment Interventions DME instruction;Gait training;Stair training;Functional mobility training;Therapeutic activities;Therapeutic exercise;Balance training;Neuromuscular re-education;Patient/family education;Modalities   PT Goals (Current goals can be found in the Care Plan section) Acute Rehab PT Goals Patient Stated Goal: Get better PT Goal Formulation: With patient Time For Goal Achievement: 01/13/15 Potential to Achieve Goals: Good    Frequency 7X/week   Barriers to discharge        Co-evaluation               End of Session Equipment Utilized During Treatment: Gait belt;Left knee immobilizer Activity Tolerance: Other (comment) (Limited  by nausea) Patient left: in bed;with call bell/phone within reach;with family/visitor present Nurse Communication: Mobility status;Other (comment) (Use knee immobilizer this evening. Feeling lightheaded)         Time: [id number] PT Time Calculation (min) (ACUTE ONLY): 29 min   Charges:   PT Evaluation $Initial PT Evaluation Tier I: 1 Procedure PT Treatments $Gait Training: 8-22 mins   PT G Codes:        [place] Mount[place]bour, Gretchen Weinfeld S 01/06/2015, 6:52 PM Sunday SpillersLogan Secor [place], [place] [phone]

## 2015-01-06 NOTE — H&P (View-Only) (Signed)
TOTAL KNEE ADMISSION H&P  Patient is being admitted for left total knee arthroplasty.  Subjective:  Chief Complaint:left knee pain.  HPI: Katherine Gilmore, 44 y.o. female, has a history of pain and functional disability in the left knee due to arthritis and has failed non-surgical conservative treatments for greater than 12 weeks to includeNSAID's and/or analgesics, corticosteriod injections, viscosupplementation injections and activity modification.  Onset of symptoms was gradual, starting 3 years ago with rapidlly worsening course since that time. The patient noted no past surgery on the left knee(s).  Patient currently rates pain in the left knee(s) at 7 out of 10 with activity. Patient has night pain, worsening of pain with activity and weight bearing, pain that interferes with activities of daily living and crepitus.  Patient has evidence of subchondral sclerosis and joint space narrowing by imaging studies. There is no active infection.  There are no active problems to display for this patient.  No past medical history on file.  No past surgical history on file.   (Not in a hospital admission) Allergies not on file  History  Substance Use Topics  . Smoking status: Not on file  . Smokeless tobacco: Not on file  . Alcohol Use: Not on file    No family history on file.   Review of Systems  Constitutional: Negative.   HENT: Negative.   Eyes: Negative.   Respiratory: Negative.   Cardiovascular: Negative.   Gastrointestinal: Negative.   Genitourinary: Negative.   Musculoskeletal: Positive for joint pain.  Skin: Negative.   Neurological: Negative.   Endo/Heme/Allergies: Negative.   Psychiatric/Behavioral: Negative.     Objective:  Physical Exam  Constitutional: She is oriented to person, place, and time. She appears well-developed and well-nourished.  HENT:  Head: Normocephalic and atraumatic.  Eyes: EOM are normal. Pupils are equal, round, and reactive to light.  Neck:  Normal range of motion. Neck supple.  Cardiovascular: Normal rate and regular rhythm.  Exam reveals no gallop and no friction rub.   No murmur heard. Respiratory: Effort normal and breath sounds normal. No respiratory distress. She has no wheezes. She has no rales.  GI: Soft. Bowel sounds are normal.  Musculoskeletal:  Examination of patient's bilateral knees reveal significant lateral joint line tenderness on both the left and right knee, worse on the left.  1+ effusion bilaterally.  Severe knee valgus with standing.  Severe foot pronation.  Retropatellar grind is 2+ bilaterally.  Ligamentous structures are intact.  Negative McMurray's and Thessaly bilaterally.  Patient's gait is significantly abnormal with significant pronation, valgus stress on the knees, and internal rotation of the hips.  Neurovascularly intact distally.   Neurological: She is alert and oriented to person, place, and time.  Skin: Skin is warm and dry.  Psychiatric: She has a normal mood and affect. Her behavior is normal. Judgment and thought content normal.    Vital signs in last 24 hours: @VSRANGES@  Labs:   There is no height or weight on file to calculate BMI.   Imaging Review Plain radiographs demonstrate severe degenerative joint disease of the left knee(s). The overall alignment ismild valgus. The bone quality appears to be fair for age and reported activity level.  Assessment/Plan:  End stage arthritis, left knee   The patient history, physical examination, clinical judgment of the provider and imaging studies are consistent with end stage degenerative joint disease of the left knee(s) and total knee arthroplasty is deemed medically necessary. The treatment options including medical management, injection therapy   arthroscopy and arthroplasty were discussed at length. The risks and benefits of total knee arthroplasty were presented and reviewed. The risks due to aseptic loosening, infection, stiffness, patella  tracking problems, thromboembolic complications and other imponderables were discussed. The patient acknowledged the explanation, agreed to proceed with the plan and consent was signed. Patient is being admitted for inpatient treatment for surgery, pain control, PT, OT, prophylactic antibiotics, VTE prophylaxis, progressive ambulation and ADL's and discharge planning. The patient is planning to be discharged home with home health services

## 2015-01-06 NOTE — Progress Notes (Signed)
Orthopedic Tech Progress Note Patient Details:  Delbert Harnessiffany Y Grego 1970-09-09 [id number]  Patient ID: Delbert Harnessiffany Y Cueva, female   DOB: 1970-09-09, 44 y.o.   MRN: [id number] Footsie roll  Nikki DomCrawford, Joon Pohle 01/06/2015, 12:39 PM

## 2015-01-06 NOTE — Progress Notes (Signed)
Report given to angel rn as caegiver

## 2015-01-06 NOTE — Interval H&P Note (Signed)
History and Physical Interval Note:  01/06/2015 8:24 AM  Katherine Gilmore  has presented today for surgery, with the diagnosis of DJD LEFT KNEE  The various methods of treatment have been discussed with the patient and family. After consideration of risks, benefits and other options for treatment, the patient has consented to  Procedure(s): TOTAL KNEE ARTHROPLASTY (Left) as a surgical intervention .  The patient's history has been reviewed, patient examined, no change in status, stable for surgery.  I have reviewed the patient's chart and labs.  Questions were answered to the patient's satisfaction.     [name] F

## 2015-01-06 NOTE — Anesthesia Postprocedure Evaluation (Signed)
  Anesthesia Post-op Note  Patient: Delbert Harnessiffany Y Sharma  Procedure(s) Performed: Procedure(s): TOTAL KNEE ARTHROPLASTY (Left)  Patient Location: PACU  Anesthesia Type:General  Level of Consciousness: awake  Airway and Oxygen Therapy: Patient Spontanous Breathing  Post-op Pain: mild  Post-op Assessment: Post-op Vital signs reviewed, Patient's Cardiovascular Status Stable, Respiratory Function Stable, Patent Airway and No signs of Nausea or vomiting LLE Motor Response: Responds to commands LLE Sensation: Full sensation          Post-op Vital Signs: Reviewed and stable  Last Vitals:  Filed Vitals:   01/06/15 1215  BP: 164/92  Pulse: 66  Temp:   Resp: 15    Complications: No apparent anesthesia complications

## 2015-01-06 NOTE — Progress Notes (Signed)
Orthopedic Tech Progress Note Patient Details:  Katherine Gilmore 04-12-1971 [id number] On cpm at  7:15 pm Patient ID: Katherine Gilmore, female   DOB: 04-12-1971, 44 y.o.   MRN: [id number]   [name], [name] 01/06/2015, 7:15 PM

## 2015-01-06 NOTE — Discharge Summary (Signed)
Patient ID: Katherine Gilmore MRN: [id number] DOB/AGE: 08-03-1970 44 y.o.  Admit date: 01/06/2015 Discharge date: 01/07/2015  Admission Diagnoses:  Active Problems:   DJD (degenerative joint disease) of knee   Discharge Diagnoses:  Same  Past Medical History  Diagnosis Date  . Hypertension     not on BP meds currently    Surgeries: Procedure(s): TOTAL KNEE ARTHROPLASTY on 01/06/2015   Consultants:    Discharged Condition: Improved  Hospital Course: Katherine Harnessiffany Y Vegh is an 44 y.o. female who was admitted 01/06/2015 for operative treatment of primary localized osteoarthritis left knee. Patient has severe unremitting pain that affects sleep, daily activities, and work/hobbies. After pre-op clearance the patient was taken to the operating room on 01/06/2015 and underwent  Procedure(s): TOTAL KNEE ARTHROPLASTY.  Patient with a pre-op Hb of 13.4 developed ABLA on pod #1 with a  Hb of 11.3.  Patient is currently stable but we will continue to follow.  Patient was given perioperative antibiotics:      Anti-infectives    Start     Dose/Rate Route Frequency Ordered Stop   01/06/15 1700  ceFAZolin (ANCEF) IVPB 2 g/50 mL premix     2 g 100 mL/hr over 30 Minutes Intravenous Every 6 hours 01/06/15 1615 01/06/15 2252   01/06/15 0930  ceFAZolin (ANCEF) IVPB 2 g/50 mL premix     2 g 100 mL/hr over 30 Minutes Intravenous To ShortStay Surgical 01/05/15 1351 01/06/15 1039       Patient was given sequential compression devices, early ambulation, and chemoprophylaxis to prevent DVT.  Patient benefited maximally from hospital stay and there were no complications.    Recent vital signs:  Patient Vitals for the past 24 hrs:  BP Temp Temp src Pulse Resp SpO2 Height Weight  01/07/15 0519 130/74 mmHg 97.8 F (36.6 C) - 86 18 96 % - -  01/07/15 0044 (!) 146/98 mmHg 97.8 F (36.6 C) - 92 18 97 % - -  01/06/15 2021 (!) 146/83 mmHg 97.8 F (36.6 C) - 71 18 94 % - -  01/06/15 1559 (!) 143/86 mmHg  97.1 F (36.2 C) - 70 18 99 % - -  01/06/15 1530 (!) 129/97 mmHg 97.2 F (36.2 C) - (!) 105 19 96 % - -  01/06/15 1500 (!) 144/93 mmHg - - 84 12 95 % - -  01/06/15 1444 137/89 mmHg - - 71 10 94 % - -  01/06/15 1429 138/81 mmHg - - 75 10 94 % - -  01/06/15 1414 130/82 mmHg - - 69 11 94 % - -  01/06/15 1359 (!) 144/97 mmHg - - 69 11 97 % - -  01/06/15 1345 140/88 mmHg 97.4 F (36.3 C) - (!) 104 15 97 % - -  01/06/15 1330 (!) 173/91 mmHg - - 78 15 100 % - -  01/06/15 1315 (!) 163/101 mmHg - - 62 (!) 9 100 % - -  01/06/15 1300 (!) 156/92 mmHg - - 63 10 100 % - -  01/06/15 1245 (!) 165/103 mmHg - - 66 13 100 % - -  01/06/15 1230 (!) 153/101 mmHg - - 62 12 99 % - -  01/06/15 1215 (!) 164/92 mmHg - - 66 15 100 % - -  01/06/15 1200 (!) 166/104 mmHg - - 73 17 100 % - -  01/06/15 1145 (!) 144/86 mmHg 97.8 F (36.6 C) - 80 12 98 % - -  01/06/15 [id number] - - - - - - 5'  6.6" (1.692 m) -  01/06/15 0827 (!) 163/90 mmHg 97.5 F (36.4 C) Oral 82 18 98 % - 104.781 kg (231 lb)     Recent laboratory studies:   Recent Labs  01/07/15 0340  WBC 14.6*  HGB 11.3*  HCT 35.2*  PLT 276  NA 139  K 3.9  CL 107  CO2 26  BUN 6  CREATININE 0.85  GLUCOSE 111*  CALCIUM 9.0     Discharge Medications:     Medication List    STOP taking these medications        naproxen sodium 220 MG tablet  Commonly known as:  ANAPROX     TURMERIC PO      TAKE these medications        apixaban 2.5 MG Tabs tablet  Commonly known as:  ELIQUIS  Take 1 tab po q12 hours x 14 days following surgery to prevent blood clots     bisacodyl 5 MG EC tablet  Commonly known as:  DULCOLAX  Take 1 tablet (5 mg total) by mouth daily as needed for moderate constipation.     Magnesium 500 MG Tabs  Take 500 mg by mouth at bedtime.     methocarbamol 500 MG tablet  Commonly known as:  ROBAXIN  Take 1 tablet (500 mg total) by mouth 4 (four) times daily.     multivitamin with minerals Tabs tablet  Take 1 tablet by mouth at  bedtime.     ondansetron 4 MG tablet  Commonly known as:  ZOFRAN  Take 1 tablet (4 mg total) by mouth every 8 (eight) hours as needed for nausea or vomiting.     oxyCODONE-acetaminophen 5-325 MG per tablet  Commonly known as:  ROXICET  Take 1-2 tablets by mouth every 4 (four) hours as needed.     VITAMIN D PO  Take 1 tablet by mouth daily with lunch.        Diagnostic Studies: Dg Knee Left Port  01/06/2015   CLINICAL DATA:  Status post left total knee arthroplasty.  EXAM: PORTABLE LEFT KNEE - 1-2 VIEW  COMPARISON:  None  FINDINGS: The patient is status post left total knee arthroplasty. The hardware components are in anatomic alignment. No complications. Gas is identified within the overlying soft tissues. A surgical drain is noted within the suprapatellar joint space.  IMPRESSION: 1. No complications status post left total knee arthroplasty.   Electronically Signed   By: Signa Kell M.D.   On: 01/06/2015 16:55    Disposition: Final discharge disposition not confirmed    Follow-up Information    Follow up with [facility], MD. Schedule an appointment as soon as possible for a visit in 2 weeks.   Specialty:  Orthopedic Surgery   Contact information:   [address] [place] [address] [phone]        Signed: Otilio Saber 01/07/2015, 6:01 AM

## 2015-01-06 NOTE — Progress Notes (Signed)
Orthopedic Tech Progress Note Patient Details:  Katherine Harnessiffany Y Gilmore Dec 02, 1970 [id number]  CPM Left Knee CPM Left Knee: On Left Knee Flexion (Degrees): 90 Left Knee Extension (Degrees): 0 Ortho will provide trapeze bar patient helper when one becomes available  Nikki DomCrawford, Jaleesa Cervi 01/06/2015, 12:38 PM

## 2015-01-07 ENCOUNTER — Encounter (HOSPITAL_COMMUNITY): Payer: Self-pay | Admitting: Orthopedic Surgery

## 2015-01-07 LAB — BASIC METABOLIC PANEL
Anion gap: 6 (ref 5–15)
BUN: 6 mg/dL (ref 6–20)
CO2: 26 mmol/L (ref 22–32)
CREATININE: 0.85 mg/dL (ref 0.44–1.00)
Calcium: 9 mg/dL (ref 8.9–10.3)
Chloride: 107 mmol/L (ref 101–111)
GFR calc non Af Amer: >60 mL/min (ref >60)
Glucose, Bld: 111 mg/dL — ABNORMAL HIGH (ref 65–99)
Potassium: 3.9 mmol/L (ref 3.5–5.1)
SODIUM: 139 mmol/L (ref 135–145)

## 2015-01-07 LAB — CBC
HEMATOCRIT: 35.2 % — AB (ref 36.0–46.0)
Hemoglobin: 11.3 g/dL — ABNORMAL LOW (ref 12.0–15.0)
MCH: 26.7 pg (ref 26.0–34.0)
MCHC: 32.1 g/dL (ref 30.0–36.0)
MCV: 83.2 fL (ref 78.0–100.0)
Platelets: 276 10*3/uL (ref 150–400)
RBC: 4.23 MIL/uL (ref 3.87–5.11)
RDW: 13.8 % (ref 11.5–15.5)
WBC: 14.6 10*3/uL — AB (ref 4.0–10.5)

## 2015-01-07 NOTE — Progress Notes (Signed)
Physical Therapy Treatment Patient Details Name: Katherine Gilmore MRN: [id number] DOB: 15-Jul-1970 Today's Date: 01/07/2015    History of Present Illness 44 y.o. female s/p left total knee arthroplasty.    PT Comments    Pt progressing towards goals. Demonstrated increased weight bearing and ambulation tolerance today. Progressed pt from a step to gait pattern to a step through gait pattern. Pt also completed stair training with no loss of balance. Pt is ready to be DC home once medically cleared.  Follow Up Recommendations  Home health PT;Supervision for mobility/OOB     Equipment Recommendations  None recommended by PT    Recommendations for Other Services       Precautions / Restrictions Precautions Precautions: Knee Precaution Booklet Issued: Yes (comment) Precaution Comments: Reviewed precautions Required Braces or Orthoses: Knee Immobilizer - Left Knee Immobilizer - Left: On when out of bed or walking Restrictions Weight Bearing Restrictions: Yes LLE Weight Bearing: Weight bearing as tolerated    Mobility  Bed Mobility               General bed mobility comments: in chair.   Transfers Overall transfer level: Needs assistance Equipment used: Rolling walker (2 wheeled) Transfers: Sit to/from Stand Sit to Stand: Min guard         General transfer comment: cues for hand placement.   Ambulation/Gait Ambulation/Gait assistance: Min guard Ambulation Distance (Feet): 200 Feet Assistive device: Rolling walker (2 wheeled) Gait Pattern/deviations: Step-to pattern;Step-through pattern;Trunk flexed;Antalgic Gait velocity: slow Gait velocity interpretation: Below normal speed for age/gender General Gait Details: Pt stated that L Knee felt stronger today "it is locking out now."  Focused of step through Gt, cues for keeping head up. During ambulation pt wore knee brace.   Stairs Stairs: Yes Stairs assistance: Min guard Stair Management: One rail  Left;Sideways;Step to pattern Number of Stairs: 3 (x2) General stair comments: pt able to ascend and decend stairs with Min gaurd A for safety. VC for proper stair technique, going up with unenvoled leg and down with the envoled leg.  Wheelchair Mobility    Modified Rankin (Stroke Patients Only)       Balance Overall balance assessment: Needs assistance Sitting-balance support: No upper extremity supported;Feet supported Sitting balance-Leahy Scale: Good     Standing balance support: During functional activity;No upper extremity supported Standing balance-Leahy Scale: Fair                      Cognition Arousal/Alertness: Awake/alert Behavior During Therapy: WFL for tasks assessed/performed Overall Cognitive Status: Within Functional Limits for tasks assessed                      Exercises Total Joint Exercises Ankle Circles/Pumps: Left;10 reps;AROM;Seated Quad Sets: Left;10 reps;AROM;Seated Long Arc Quad: Left;10 reps;AROM;Seated;Strengthening Knee Flexion: Left;10 reps;Seated;AROM    General Comments General comments (skin integrity, edema, etc.): pt states that L knee is feeling stronger      Pertinent Vitals/Pain Pain Assessment: 0-10 Pain Score: 8  Pain Location: L knee Pain Descriptors / Indicators: Aching Pain Intervention(s): Repositioned;Patient requesting pain meds-RN notified;Ice applied    Home Living Family/patient expects to be discharged to:: Private residence Living Arrangements: Children Available Help at Discharge: Family;Available 24 hours/day (Mother staying with pt at D/c) Type of Home: Apartment (Condo) Home Access: Stairs to enter Entrance Stairs-Rails: Right;Left Home Layout: One level Home Equipment: Environmental consultant - 2 wheels;Bedside commode;Shower seat - built in      Prior Function Level  of Independence: Independent          PT Goals (current goals can now be found in the care plan section) Acute Rehab PT Goals Patient  Stated Goal: none stated. PT Goal Formulation: With patient Time For Goal Achievement: 01/13/15 Potential to Achieve Goals: Good Progress towards PT goals: Progressing toward goals    Frequency  7X/week    PT Plan Current plan remains appropriate    Co-evaluation             End of Session Equipment Utilized During Treatment: Left knee immobilizer Activity Tolerance: Patient tolerated treatment well Patient left: in chair;with call bell/phone within reach;with family/visitor present     Time: 1050-1120 PT Time Calculation (min) (ACUTE ONLY): 30 min  Charges:  $Gait Training: 8-22 mins $Therapeutic Exercise: 8-22 mins                    G Codes:      Carren Rang 01-15-2015, 2:44 PM   Dorrene German (student physical therapy assistant) Acute Rehab [phone]

## 2015-01-07 NOTE — Progress Notes (Signed)
Subjective: 1 Day Post-Op Procedure(s) (LRB): TOTAL KNEE ARTHROPLASTY (Left) Patient reports pain as moderate.  Patient reports nausea yesterday while working with PT.  This has since resolved.  No vomiting.  No lightheadedness/dizziness.    Objective: Vital signs in last 24 hours: Temp:  [97.1 F (36.2 C)-97.8 F (36.6 C)] 97.8 F (36.6 C) (07/21 0519) Pulse Rate:  [62-105] 86 (07/21 0519) Resp:  [9-19] 18 (07/21 0519) BP: (129-173)/(74-104) 130/74 mmHg (07/21 0519) SpO2:  [94 %-100 %] 96 % (07/21 0519) Weight:  [104.781 kg (231 lb)] 104.781 kg (231 lb) (07/20 0827)  Intake/Output from previous day: 07/20 0701 - 07/21 0700 In: 2300 [P.O.:500; I.V.:1800] Out: 1610 [Urine:1000; Drains:460; Blood:150] Intake/Output this shift: Total I/O In: -  Out: 40 [Drains:40]   Recent Labs  01/07/15 0340  HGB 11.3*    Recent Labs  01/07/15 0340  WBC 14.6*  RBC 4.23  HCT 35.2*  PLT 276    Recent Labs  01/07/15 0340  NA 139  K 3.9  CL 107  CO2 26  BUN 6  CREATININE 0.85  GLUCOSE 111*  CALCIUM 9.0   No results for input(s): LABPT, INR in the last 72 hours.  Neurologically intact Neurovascular intact Sensation intact distally Intact pulses distally Dorsiflexion/Plantar flexion intact Compartment soft  hemovac drain pulled by me today Negative homans bilaterally  Assessment/Plan: 1 Day Post-Op Procedure(s) (LRB): TOTAL KNEE ARTHROPLASTY (Left) Advance diet Up with therapy D/C IV fluids Discharge home with home health today vs. Tomorrow pending pain/nausea and how well she works with PT today WBAT LLE Dry dressing change prn ABLA-mild and stable  Otilio Saber 01/07/2015, 6:38 AM

## 2015-01-07 NOTE — Progress Notes (Signed)
Orthopedic Tech Progress Note Patient Details:  Katherine Gilmore 04-Jan-1971 [id number]  CPM Left Knee CPM Left Knee: On Left Knee Flexion (Degrees): 90 Left Knee Extension (Degrees): 0   Shawnie Pons 01/07/2015, 3:54 PM

## 2015-01-07 NOTE — Progress Notes (Signed)
Physical Therapy Treatment Patient Details Name: Katherine Gilmore MRN: [id number] DOB: April 09, 1971 Today's Date: 01/07/2015    History of Present Illness 44 y.o. female s/p left total knee arthroplasty.    PT Comments    Continues to make great progress towards physical therapy goals. Ambulating with RW up to 300 feet. Demonstrates good stability of Lt knee in stance phase without knee immobilizer. Tolerating therapeutic exercises well and safely completed stair training this AM. Pt adequate for d/c from PT standpoint. Has good support from family at home.  Follow Up Recommendations  Home health PT;Supervision for mobility/OOB     Equipment Recommendations  None recommended by PT    Recommendations for Other Services       Precautions / Restrictions Precautions Precautions: Knee Precaution Booklet Issued: Yes (comment) Precaution Comments: Reviewed precautions Required Braces or Orthoses: Knee Immobilizer - Left (KI in room no order noted) Restrictions Weight Bearing Restrictions: Yes LLE Weight Bearing: Weight bearing as tolerated    Mobility  Bed Mobility               General bed mobility comments: Pt in chair at start of therapy  Transfers Overall transfer level: Needs assistance Equipment used: Rolling walker (2 wheeled) Transfers: Sit to/from Stand Sit to Stand: Supervision         General transfer comment: Supervision for safety. Good WB through LLE without knee immobilizer. Encouraged to use as able for sit<>stand transitions.  Ambulation/Gait Ambulation/Gait assistance: Supervision Ambulation Distance (Feet): 300 Feet Assistive device: Rolling walker (2 wheeled) Gait Pattern/deviations: Step-through pattern;Decreased step length - right;Decreased stance time - left;Antalgic Gait velocity: decreased Gait velocity interpretation: Below normal speed for age/gender General Gait Details: Moderately antalgic, using RW for support but bearing majority of  weight through LLE. No buckling of Lt knee noted. Mechanics slightly diminished towards end of distance with cues for Lt foot clearance as she began to circumduct on Lt. No loss of balance and demos good control of RW.   Stairs            Wheelchair Mobility    Modified Rankin (Stroke Patients Only)       Balance                                    Cognition Arousal/Alertness: Awake/alert Behavior During Therapy: WFL for tasks assessed/performed Overall Cognitive Status: Within Functional Limits for tasks assessed                      Exercises Total Joint Exercises Ankle Circles/Pumps: 10 reps;AROM;Seated;Both Quad Sets: Left;10 reps;AROM;Seated Short Arc Quad: Left;10 reps;AROM;Seated;Strengthening Hip ABduction/ADduction: AAROM;Left;10 reps;Seated Straight Leg Raises: Left;10 reps;Seated;Strengthening Knee Flexion: Left;10 reps;Seated;AROM Goniometric ROM: 4-94 degrees Lt knee flexion    General Comments General comments (skin integrity, edema, etc.): Dr. Eulah Pont entered room while pt ambulating. Instructions for RN to place light bandage over incision prior to d/c      Pertinent Vitals/Pain Pain Assessment: 0-10 Pain Score: 5  Pain Location: Lt knee Pain Descriptors / Indicators: Aching Pain Intervention(s): Monitored during session;Repositioned;Patient requesting pain meds-RN notified;RN gave pain meds during session    Home Living                      Prior Function            PT Goals (current goals can now be found in the  care plan section) Acute Rehab PT Goals Patient Stated Goal: wants to go home PT Goal Formulation: With patient Time For Goal Achievement: 01/13/15 Potential to Achieve Goals: Good Progress towards PT goals: Progressing toward goals    Frequency  7X/week    PT Plan Current plan remains appropriate    Co-evaluation             End of Session Equipment Utilized During Treatment: Gait  belt Activity Tolerance: Patient tolerated treatment well Patient left: in chair;with call bell/phone within reach;with family/visitor present     Time: 1610-9604 PT Time Calculation (min) (ACUTE ONLY): 22 min  Charges:  $Gait Training: 8-22 mins                    G Codes:      Berton Mount 01-23-2015, 5:39 PM [facility] [employer], [place] [phone]

## 2015-01-07 NOTE — Discharge Instructions (Addendum)
INSTRUCTIONS AFTER JOINT REPLACEMENT  ° °o Remove items at home which could result in a fall. This includes throw rugs or furniture in walking pathways °o ICE to the affected joint every three hours while awake for 30 minutes at a time, for at least the first 3-5 days, and then as needed for pain and swelling.  Continue to use ice for pain and swelling. You may notice swelling that will progress down to the foot and ankle.  This is normal after surgery.  Elevate your leg when you are not up walking on it.   °o Continue to use the breathing machine you got in the hospital (incentive spirometer) which will help keep your temperature down.  It is common for your temperature to cycle up and down following surgery, especially at night when you are not up moving around and exerting yourself.  The breathing machine keeps your lungs expanded and your temperature down. ° °BLOOD THINNER: TAKE ELIQUIS AS INSTRUCTED FOR A TOTAL OF 14 DAYS FOLLOWING SURGERY.  ONCE FINISHED WITH THIS, TAKE ASPIRIN 325 MG ONE TAB ONCE DAILY FOR THE NEXT 14 DAYS.  BOTH OF THESE MEDICINES ARE USED TO PREVENT BLOOD CLOTS. ° °DIET:  As you were doing prior to hospitalization, we recommend a well-balanced diet. ° °DRESSING / WOUND CARE / SHOWERING ° °You may change your dressing 3-5 days after surgery.  Then change the dressing every day with sterile gauze.  Please use good hand washing techniques before changing the dressing.  Do not use any lotions or creams on the incision until instructed by your surgeon. and You may shower 3 days after surgery, but keep the wounds dry during showering.  You may use an occlusive plastic wrap (Press'n Seal for example), NO SOAKING/SUBMERGING IN THE BATHTUB.  If the bandage gets wet, change with a clean dry gauze.  If the incision gets wet, pat the wound dry with a clean towel. ° °ACTIVITY ° °o Increase activity slowly as tolerated, but follow the weight bearing instructions below.   °o No driving for 6 weeks or  until further direction given by your physician.  You cannot drive while taking narcotics.  °o No lifting or carrying greater than 10 lbs. until further directed by your surgeon. °o Avoid periods of inactivity such as sitting longer than an hour when not asleep. This helps prevent blood clots.  °o You may return to work once you are authorized by your doctor.  ° ° ° °WEIGHT BEARING  ° °Weight bearing as tolerated with assist device (walker, cane, etc) as directed, use it as long as suggested by your surgeon or therapist, typically at least 4-6 weeks. ° ° °EXERCISES ° °Results after joint replacement surgery are often greatly improved when you follow the exercise, range of motion and muscle strengthening exercises prescribed by your doctor. Safety measures are also important to protect the joint from further injury. Any time any of these exercises cause you to have increased pain or swelling, decrease what you are doing until you are comfortable again and then slowly increase them. If you have problems or questions, call your caregiver or physical therapist for advice.  ° °Rehabilitation is important following a joint replacement. After just a few days of immobilization, the muscles of the leg can become weakened and shrink (atrophy).  These exercises are designed to build up the tone and strength of the thigh and leg muscles and to improve motion. Often times heat used for twenty to thirty minutes before   working out will loosen up your tissues and help with improving the range of motion but do not use heat for the first two weeks following surgery (sometimes heat can increase post-operative swelling).  ° °These exercises can be done on a training (exercise) mat, on the floor, on a table or on a bed. Use whatever works the best and is most comfortable for you.    Use music or television while you are exercising so that the exercises are a pleasant break in your day. This will make your life better with the exercises  acting as a break in your routine that you can look forward to.   Perform all exercises about fifteen times, three times per day or as directed.  You should exercise both the operative leg and the other leg as well. ° °Exercises include: °  °• Quad Sets - Tighten up the muscle on the front of the thigh (Quad) and hold for 5-10 seconds.   °• Straight Leg Raises - With your knee straight (if you were given a brace, keep it on), lift the leg to 60 degrees, hold for 3 seconds, and slowly lower the leg.  Perform this exercise against resistance later as your leg gets stronger.  °• Leg Slides: Lying on your back, slowly slide your foot toward your buttocks, bending your knee up off the floor (only go as far as is comfortable). Then slowly slide your foot back down until your leg is flat on the floor again.  °• Angel Wings: Lying on your back spread your legs to the side as far apart as you can without causing discomfort.  °• Hamstring Strength:  Lying on your back, push your heel against the floor with your leg straight by tightening up the muscles of your buttocks.  Repeat, but this time bend your knee to a comfortable angle, and push your heel against the floor.  You may put a pillow under the heel to make it more comfortable if necessary.  ° °A rehabilitation program following joint replacement surgery can speed recovery and prevent re-injury in the future due to weakened muscles. Contact your doctor or a physical therapist for more information on knee rehabilitation.  ° ° °CONSTIPATION ° °Constipation is defined medically as fewer than three stools per week and severe constipation as less than one stool per week.  Even if you have a regular bowel pattern at home, your normal regimen is likely to be disrupted due to multiple reasons following surgery.  Combination of anesthesia, postoperative narcotics, change in appetite and fluid intake all can affect your bowels.  ° °YOU MUST use at least one of the following  options; they are listed in order of increasing strength to get the job done.  They are all available over the counter, and you may need to use some, POSSIBLY even all of these options:   ° °Drink plenty of fluids (prune juice may be helpful) and high fiber foods °Colace 100 mg by mouth twice a day  °Senokot for constipation as directed and as needed Dulcolax (bisacodyl), take with full glass of water  °Miralax (polyethylene glycol) once or twice a day as needed. ° °If you have tried all these things and are unable to have a bowel movement in the first 3-4 days after surgery call either your surgeon or your primary doctor.   ° °If you experience loose stools or diarrhea, hold the medications until you stool forms back up.  If your symptoms do not   get better within 1 week or if they get worse, check with your doctor.  If you experience "the worst abdominal pain ever" or develop nausea or vomiting, please contact the office immediately for further recommendations for treatment. ° ° °ITCHING:  If you experience itching with your medications, try taking only a single pain pill, or even half a pain pill at a time.  You can also use Benadryl over the counter for itching or also to help with sleep.  ° °TED HOSE STOCKINGS:  Use stockings on both legs until for at least 2 weeks or as directed by physician office. They may be removed at night for sleeping. ° °MEDICATIONS:  See your medication summary on the “After Visit Summary” that nursing will review with you.  You may have some home medications which will be placed on hold until you complete the course of blood thinner medication.  It is important for you to complete the blood thinner medication as prescribed. ° °PRECAUTIONS:  If you experience chest pain or shortness of breath - call 911 immediately for transfer to the hospital emergency department.  ° °If you develop a fever greater that 101 F, purulent drainage from wound, increased redness or drainage from wound, foul  odor from the wound/dressing, or calf pain - CONTACT YOUR SURGEON.   °                                                °FOLLOW-UP APPOINTMENTS:  If you do not already have a post-op appointment, please call the office for an appointment to be seen by your surgeon.  Guidelines for how soon to be seen are listed in your “After Visit Summary”, but are typically between 1-4 weeks after surgery. ° °OTHER INSTRUCTIONS:  ° °Knee Replacement:  Do not place pillow under knee, focus on keeping the knee straight while resting. CPM instructions: 0-90 degrees, 2 hours in the morning, 2 hours in the afternoon, and 2 hours in the evening. Place foam block, curve side up under heel at all times except when in CPM or when walking.  DO NOT modify, tear, cut, or change the foam block in any way. ° °MAKE SURE YOU:  °• Understand these instructions.  °• Get help right away if you are not doing well or get worse.  ° ° °Thank you for letting us be a part of your medical care team.  It is a privilege we respect greatly.  We hope these instructions will help you stay on track for a fast and full recovery!  ° °Information on my medicine - ELIQUIS® (apixaban) ° °This medication education was reviewed with me or my healthcare representative as part of my discharge preparation.  The pharmacist that spoke with me during my hospital stay was:  Kimorah Ridolfi P, RPH ° °Why was Eliquis® prescribed for you? °Eliquis® was prescribed for you to reduce the risk of blood clots forming after orthopedic surgery.   ° °What do You need to know about Eliquis®? °Take your Eliquis® TWICE DAILY - one tablet in the morning and one tablet in the evening with or without food.  It would be best to take the dose about the same time each day. ° °If you have difficulty swallowing the tablet whole please discuss with your pharmacist how to take the medication safely. ° °Take Eliquis® exactly as   prescribed by your doctor and DO NOT stop taking Eliquis® without talking to  the doctor who prescribed the medication.  Stopping without other medication to take the place of Eliquis® may increase your risk of developing a clot. ° °After discharge, you should have regular check-up appointments with your healthcare provider that is prescribing your Eliquis®. ° °What do you do if you miss a dose? °If a dose of ELIQUIS® is not taken at the scheduled time, take it as soon as possible on the same day and twice-daily administration should be resumed.  The dose should not be doubled to make up for a missed dose.  Do not take more than one tablet of ELIQUIS at the same time. ° °Important Safety Information °A possible side effect of Eliquis® is bleeding. You should call your healthcare provider right away if you experience any of the following: °? Bleeding from an injury or your nose that does not stop. °? Unusual colored urine (red or dark brown) or unusual colored stools (red or black). °? Unusual bruising for unknown reasons. °? A serious fall or if you hit your head (even if there is no bleeding). ° °Some medicines may interact with Eliquis® and might increase your risk of bleeding or clotting while on Eliquis®. To help avoid this, consult your healthcare provider or pharmacist prior to using any new prescription or non-prescription medications, including herbals, vitamins, non-steroidal anti-inflammatory drugs (NSAIDs) and supplements. ° °This website has more information on Eliquis® (apixaban): http://www.eliquis.com/eliquis/home ° ° °

## 2015-01-07 NOTE — Care Management Utilization Note (Signed)
Please send to [facility]. Thanks, Vance Peper

## 2015-01-07 NOTE — Evaluation (Signed)
Occupational Therapy Evaluation Patient Details Name: Katherine Gilmore MRN: [id number] DOB: 09/11/1970 Today's Date: 01/07/2015    History of Present Illness 44 y.o. female s/p left total knee arthroplasty.   Clinical Impression   Pt doing well. Feel pt is ok to d/c from OT standpoint today. Will follow if here after today for progression of ADL. Practiced up to 3in1 with education on toileting, bathing, dressing and shower transfers.     Follow Up Recommendations  No OT follow up;Supervision/Assistance - 24 hour    Equipment Recommendations  None recommended by OT (pt states 3in1 already delivered to home.)    Recommendations for Other Services       Precautions / Restrictions Precautions Precautions: Knee Precaution Booklet Issued: Yes (comment) Precaution Comments: Reviewed precautions Required Braces or Orthoses: Knee Immobilizer - Left Knee Immobilizer - Left: On when out of bed or walking Restrictions Weight Bearing Restrictions: Yes LLE Weight Bearing: Weight bearing as tolerated      Mobility Bed Mobility               General bed mobility comments: in chair.   Transfers Overall transfer level: Needs assistance Equipment used: Rolling walker (2 wheeled) Transfers: Sit to/from Stand Sit to Stand: Min guard         General transfer comment: cues for hand placement.     Balance                                            ADL Overall ADL's : Needs assistance/impaired Eating/Feeding: Independent;Sitting   Grooming: Wash/dry hands;Min guard;Standing   Upper Body Bathing: Set up;Sitting   Lower Body Bathing: Moderate assistance;Sit to/from stand   Upper Body Dressing : Set up;Sitting   Lower Body Dressing: Moderate assistance;Sit to/from stand   Toilet Transfer: Min guard;Ambulation;RW   Toileting- Architect and Hygiene: Min guard;Sit to/from stand         General ADL Comments: Demonstrated shower transfer  technique and explained option of using 3in1 as shower chair as pt only has a built in corner seat in shower. Pt states she will likely start out sponge bathing. explained she needs to wear KI when up OOB right now so if she still isnt able to SLR by the time she wants to do shower (once approved to shower) then she will need to wear KI to step in and out of shower. She states family will assist with LB self care and she plans to wear mostly gowns.      Vision     Perception     Praxis      Pertinent Vitals/Pain Pain Assessment: 0-10 Pain Score: 8  Pain Location: L knee Pain Descriptors / Indicators: Aching Pain Intervention(s): Repositioned;Patient requesting pain meds-RN notified;Ice applied     Hand Dominance Right   Extremity/Trunk Assessment Upper Extremity Assessment Upper Extremity Assessment: Overall WFL for tasks assessed           Communication Communication Communication: No difficulties   Cognition Arousal/Alertness: Awake/alert Behavior During Therapy: WFL for tasks assessed/performed Overall Cognitive Status: Within Functional Limits for tasks assessed                     General Comments       Exercises       Shoulder Instructions      Home Living Family/patient expects to  be discharged to:: Private residence Living Arrangements: Children Available Help at Discharge: Family;Available 24 hours/day (Mother staying with pt at D/c) Type of Home: Apartment (Condo) Home Access: Stairs to enter [employer] of Steps: 10 Entrance Stairs-Rails: Right;Left Home Layout: One level     Bathroom Shower/Tub: Producer, television/film/video: Standard     Home Equipment: Environmental consultant - 2 wheels;Bedside commode;Shower seat - built in          Prior Functioning/Environment Level of Independence: Independent             OT Diagnosis: Generalized weakness   OT Problem List: Decreased strength;Decreased knowledge of use of DME or AE    OT Treatment/Interventions: Self-care/ADL training;Patient/family education;Therapeutic activities;DME and/or AE instruction    OT Goals(Current goals can be found in the care plan section) Acute Rehab OT Goals Patient Stated Goal: none stated. OT Goal Formulation: With patient Time For Goal Achievement: 01/14/15 Potential to Achieve Goals: Good  OT Frequency: Min 2X/week   Barriers to D/C:            Co-evaluation              End of Session Equipment Utilized During Treatment: Rolling walker;Left knee immobilizer  Activity Tolerance: Patient tolerated treatment well Patient left: in chair;with call bell/phone within reach;with family/visitor present   Time: 1610-9604 OT Time Calculation (min): 26 min Charges:  OT General Charges $OT Visit: 1 Procedure OT Evaluation $Initial OT Evaluation Tier I: 1 Procedure OT Treatments $Therapeutic Activity: 8-22 mins G-Codes:    Lennox Laity  [phone] 01/07/2015, 12:08 PM

## 2015-01-07 NOTE — Care Management Note (Signed)
Case Management Note  Patient Details  Name: Katherine Gilmore MRN: [id number] Date of Birth: 08-28-1970  Subjective/Objective:   44 yr old female s/p left total knee arthroplasty.                 Action/Plan: Case manager spoke with patient concerning home health and DME needs at discharge. Patient was preoperatively setup with [facility], no changes. Patient has family support at discharge.    Expected Discharge Date:     01/08/15             Expected Discharge Plan:   Home with Home Health  In-House Referral:  NA  Discharge planning Services  CM Consult  Post Acute Care Choice:    Choice offered to:  Patient  DME Arranged:  3-N-1, CPM, Walker rolling DME Agency:  [employer]  HH Arranged:  PT HH Agency:     Status of Service:     Medicare Important Message Given:    Date Medicare IM Given:    Medicare IM give by:    Date Additional Medicare IM Given:    Additional Medicare Important Message give by:     If discussed at Long Length of Stay Meetings, dates discussed:    Additional Comments:  Durenda Guthrie, RN 01/07/2015, 12:23 PM

## 2015-01-28 ENCOUNTER — Ambulatory Visit: Payer: BLUE CROSS/BLUE SHIELD | Attending: Orthopedic Surgery | Admitting: Physical Therapy

## 2015-01-28 DIAGNOSIS — M25562 Pain in left knee: Secondary | ICD-10-CM | POA: Diagnosis present

## 2015-01-28 DIAGNOSIS — R29898 Other symptoms and signs involving the musculoskeletal system: Secondary | ICD-10-CM | POA: Diagnosis present

## 2015-01-28 DIAGNOSIS — M7989 Other specified soft tissue disorders: Secondary | ICD-10-CM | POA: Insufficient documentation

## 2015-01-28 DIAGNOSIS — M25662 Stiffness of left knee, not elsewhere classified: Secondary | ICD-10-CM | POA: Insufficient documentation

## 2015-01-28 DIAGNOSIS — R269 Unspecified abnormalities of gait and mobility: Secondary | ICD-10-CM | POA: Insufficient documentation

## 2015-01-28 NOTE — Therapy (Signed)
[facility]patient [facility] [address]  [address] [place], [place], [address] Phone: [phone]   Fax:  [phone]  Physical Therapy Evaluation  Patient Details  Name: Katherine Gilmore MRN: [id number] Date of Birth: [birth year] Referring Provider:  Loreta Ave, MD  Encounter Date: 01/28/2015      PT End of Session - 01/28/15 1359    Visit Number 1   Number of Visits 12   Date for PT Re-Evaluation 03/11/15   PT Start Time 1304   PT Stop Time 1358   PT Time Calculation (min) 54 min   Activity Tolerance Patient tolerated treatment well   Behavior During Therapy [facility] for tasks assessed/performed      Past Medical History  Diagnosis Date  . Hypertension     not on BP meds currently    Past Surgical History  Procedure Laterality Date  . Breast reduction surgery    . Uterine fibroid surgery    . Cesarean section    . Hernia repair      as a baby  . Abdominal hysterectomy    . Total knee arthroplasty Left 01/06/2015    Procedure: TOTAL KNEE ARTHROPLASTY;  Surgeon: Mckinley Jewel, MD;  Location: [facility] OR;  Service: Orthopedics;  Laterality: Left;    There were no vitals filed for this visit.  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 Initially treated with injections but limited long term relief, therfore opted to have TKR to improve quality of life and be able to keep up with son. Patient ~3 wks s/p Left TKR. Reports good post-op course and has weaned self off pain meds except at night. Weaned off RW to cane after 2-3 days at home, and now no longer using cane. Patient reports current HEP consists of walking, standing hip exercises, and minisquats.   Pertinent History Left TKR 01/06/15   Patient Stated Goals "I just  want it to work good ... work out the stiffness."   Currently in Pain? No/denies   Pain Score --  Typically no pain, just some discomfort at times   Pain Location Knee   Pain Frequency Rarely            [facility] PT Assessment - 01/28/15 1317    Assessment   Medical Diagnosis Left TKR   Onset Date/Surgical Date 01/06/15   Next MD Visit 02/23/15   Prior Therapy --  [facility] PT   Balance Screen   Has the patient fallen in the past 6 months No   Has the patient had a decrease in activity level because of a fear of falling?  No   Is the patient reluctant to leave their home because of a fear of falling?  No   Home Environment   Living Environment Private residence   Type of Home Apartment   Home Access Stairs to enter   Entrance Stairs-Number of Steps 12   Entrance Stairs-Rails Right;Left;Cannot reach both   Home Layout One level   Observation/Other Assessments   Focus on Therapeutic Outcomes (FOTO)  56% (44% limitation); Predicted 64% (36% limited)   ROM / Strength   AROM / PROM / Strength AROM;PROM;Strength   AROM   AROM Assessment Site Knee  Right/Left Knee Right;Left   Right Knee Extension 0   Right Knee Flexion 130   Left Knee Extension 8   Left Knee Flexion 101   PROM   PROM Assessment Site Knee   Right/Left Knee Left   Left Knee Extension 6   Left Knee Flexion 118   Strength   Strength Assessment Site Knee;Hip   Right/Left Hip Right;Left   Right Hip Flexion 5/5   Right Hip Extension 4+/5   Right Hip ABduction 5/5   Right Hip ADduction 4+/5   Left Hip Flexion 3/5   Left Hip Extension 3+/5   Left Hip ABduction 4-/5   Left Hip ADduction 3/5   Right/Left Knee Right;Left   Right Knee Flexion 5/5   Right Knee Extension 5/5   Left Knee Flexion 4-/5   Left Knee Extension 4-/5   Flexibility   Soft Tissue Assessment /Muscle Length yes   Hamstrings mildly tighter on left vs right   Palpation   Patella mobility limited due to edema   Ambulation/Gait   Ambulation/Gait  Assistance 7: Independent   Assistive device None   Gait Pattern Step-through pattern;Decreased stance time - left;Antalgic                   [facility] Adult PT Treatment/Exercise - 01/28/15 1317    Exercises   Exercises Knee/Hip   Knee/Hip Exercises: Stretches   Active Hamstring Stretch Left;30 seconds;2 reps   Active Hamstring Stretch Limitations Hamstring/gastroc, Seated with strap   Knee: Self-Stretch to increase Flexion Left;30 seconds;2 reps   Knee: Self-Stretch Limitations Seated & Step stretch   Knee/Hip Exercises: Standing   Terminal Knee Extension Strengthening;Left;10 reps   Terminal Knee Extension Limitations with small ball   Knee/Hip Exercises: Supine   Short Arc Quad Sets Strengthening;Left;10 reps   Straight Leg Raises Strengthening;Left;10 reps   Straight Leg Raises Limitations 3-5" hold                PT Education - 01/28/15 1513    Education provided Yes   Education Details HEP; Instructions for frequency and duration with icing knee   Person(s) Educated Patient   Methods Explanation;Demonstration;Handout   Comprehension Verbalized understanding;Returned demonstration;Need further instruction             PT Long Term Goals - 01/28/15 1529    PT LONG TERM GOAL #1   Title Independent with HEP (03/11/15)   Time 6   Period Weeks   Status New   PT LONG TERM GOAL #2   Title Left knee AROM 2-120 to allow for normal biomechanics with gait and stair climbing (03/11/15)   Time 6   Period Weeks   Status New   PT LONG TERM GOAL #3   Title Demo left LE strength 4+/5 or greater to improve function and stability (03/11/15)   Time 6   Period Weeks   Status New   PT LONG TERM GOAL #4   Title Ambulate with normal gait pattern on all surfaces (03/11/15)   Time 6   Period Weeks   Status New   PT LONG TERM GOAL #5   Title Ascend/descend stairs with reciprocal gait pattern (03/11/15)   Time 6   Period Weeks   Status New                Plan - 01/28/15 1515    Clinical Impression Statement Patient is a 44 y/o female who presents to OP PT ~3 weeks s/p left TKR on  01/06/15. Upon evaulation, patient arrived to therapy wihout AD having already weaned herself off the [facility]. Patient demonstrates slight antalgic gait with decreased weight to left and decreased knee ROM during stride. AROM left knee is 8-101 and PROM 6-118. Weakness noted in hip musculature averaging 3 to3+/5 and knee at 4-/5. Patient reports stair ascent/descent with step-to pattern.   Pt will benefit from skilled therapeutic intervention in order to improve on the following deficits Impaired flexibility;Decreased range of motion;Decreased strength;Difficulty walking;Abnormal gait;Decreased balance;Decreased activity tolerance;Pain;Increased edema   Rehab Potential Excellent   PT Frequency 2x / week   PT Duration 6 weeks   PT Treatment/Interventions Therapeutic exercise;Manual techniques;Passive range of motion;Therapeutic activities;Functional mobility training;Gait training;Stair training;Electrical Stimulation;Vasopneumatic Device;Cryotherapy;Patient/family education   PT Next Visit Plan Review of HEP, TKR protocol, modalities PRN   Consulted and Agree with Plan of Care Patient         Problem List Patient Active Problem List   Diagnosis Date Noted  . DJD (degenerative joint disease) of knee 01/06/2015    Marry Guan, PT, MPT 01/28/2015, 3:46 PM  [facility] [address]  [address] [place], [place], [address] Phone: [phone]   Fax:  [phone]

## 2015-02-01 ENCOUNTER — Ambulatory Visit: Payer: BLUE CROSS/BLUE SHIELD | Admitting: Rehabilitation

## 2015-02-01 DIAGNOSIS — R269 Unspecified abnormalities of gait and mobility: Secondary | ICD-10-CM

## 2015-02-01 DIAGNOSIS — M25662 Stiffness of left knee, not elsewhere classified: Secondary | ICD-10-CM | POA: Diagnosis not present

## 2015-02-01 DIAGNOSIS — M25562 Pain in left knee: Secondary | ICD-10-CM

## 2015-02-01 DIAGNOSIS — R29898 Other symptoms and signs involving the musculoskeletal system: Secondary | ICD-10-CM

## 2015-02-01 DIAGNOSIS — M7989 Other specified soft tissue disorders: Secondary | ICD-10-CM

## 2015-02-01 NOTE — Therapy (Signed)
[facility] Outpatient Rehabilitation [facility] [address]  [address] [place], [place], [address] Phone: [phone]   Fax:  [phone]  Physical Therapy Treatment  Patient Details  Name: Katherine Gilmore MRN: [id number] Date of Birth: Dec 06, 1970 Referring Provider:  Loreta Ave, MD  Encounter Date: 02/01/2015      PT End of Session - 02/01/15 0933    Visit Number 2   Number of Visits 12   Date for PT Re-Evaluation 03/11/15   PT Start Time 0933   PT Stop Time 1011   PT Time Calculation (min) 38 min   Activity Tolerance Patient tolerated treatment well   Behavior During Therapy [facility] for tasks assessed/performed      Past Medical History  Diagnosis Date  . Hypertension     not on BP meds currently    Past Surgical History  Procedure Laterality Date  . Breast reduction surgery    . Uterine fibroid surgery    . Cesarean section    . Hernia repair      as a baby  . Abdominal hysterectomy    . Total knee arthroplasty Left 01/06/2015    Procedure: TOTAL KNEE ARTHROPLASTY;  Surgeon: Mckinley Jewel, MD;  Location: [facility];  Service: Orthopedics;  Laterality: Left;    There were no vitals filed for this visit.  Visit Diagnosis:  Stiffness of knee joint, left  Swelling of limb  Left knee pain  Abnormality of gait  Weakness of left leg      Subjective Assessment - 02/01/15 0936    Subjective Reports more stiffness than pain today. Reports complaince with HEP.    Currently in Pain? No/denies                         [facility] Adult PT Treatment/Exercise - 02/01/15 0935    Exercises   Exercises Knee/Hip   Knee/Hip Exercises: Stretches   Passive Hamstring Stretch Left;2 reps;30 seconds   Passive Hamstring Stretch Limitations Supine with Strap   Knee/Hip Exercises: Aerobic   Stationary Bike level 1x5'   Knee/Hip Exercises: Standing   Hip Flexion Both;AROM;10 reps;Knee bent   Hip Flexion Limitations Standing on blue foam,  march, with 2 pole assist   Hip Abduction Both;AROM;10 reps;Knee straight   Abduction Limitations Standing on blue foam, alternating, with 2 pole assist   Lateral Step Up Left;10 reps;Hand Hold: 1;Hand Hold: 0;Step Height: 6"   Forward Step Up Left;10 reps;Hand Hold: 1;Hand Hold: 0;Step Height: 6"   Functional Squat 10 reps;3 seconds   Functional Squat Limitations TRX   Knee/Hip Exercises: Seated   Hamstring Curl Left;10 reps   Hamstring Limitations Green TB   Knee/Hip Exercises: Supine   Short Arc Quad Sets Strengthening;Left;10 reps   Short Arc Quad Sets Limitations 2# with 5" hold   Heel Slides AAROM;Both;10 reps   Heel Slides Limitations 3" hold with feet on peanut ball   Straight Leg Raises Strengthening;Left;2 sets;10 reps  first set 6 reps   Straight Leg Raises Limitations First set with 2# with quad lag present then switched to no weight    Manual Therapy   Manual Therapy Passive ROM;Soft tissue mobilization;Joint mobilization   Joint Mobilization Patellar mobs: medial-lateral, inferior-superior   Soft tissue mobilization Crossfriction to incision   Passive ROM Into flexion and extension to pt tolerance  PT Long Term Goals - 02/01/15 1012    PT LONG TERM GOAL #1   Title Independent with HEP (03/11/15)   Status On-going   PT LONG TERM GOAL #2   Title Left knee AROM 2-120 to allow for normal biomechanics with gait and stair climbing (03/11/15)   Status On-going   PT LONG TERM GOAL #3   Title Demo left LE strength 4+/5 or greater to improve function and stability (03/11/15)   Status On-going   PT LONG TERM GOAL #4   Title Ambulate with normal gait pattern on all surfaces (03/11/15)   Status On-going   PT LONG TERM GOAL #5   Title Ascend/descend stairs with reciprocal gait pattern (03/11/15)   Status On-going               Plan - 02/01/15 1009    Clinical Impression Statement Good tolerance to all exercises with minimal complaint of  pain. Noted good weightshift with squat exercise and able to ascend 6" step without HHA. Pt declined ice today and stated she would ice at home.    PT Next Visit Plan Review of HEP, TKR protocol, modalities PRN   Consulted and Agree with Plan of Care Patient        Problem List Patient Active Problem List   Diagnosis Date Noted  . DJD (degenerative joint disease) of knee 01/06/2015    Ronney Lion, PTA 02/01/2015, 10:13 AM  [facility] [address]  [address] [place], [place], [address] Phone: [phone]   Fax:  [phone]

## 2015-02-05 ENCOUNTER — Ambulatory Visit: Payer: BLUE CROSS/BLUE SHIELD | Admitting: Physical Therapy

## 2015-02-05 DIAGNOSIS — M25662 Stiffness of left knee, not elsewhere classified: Secondary | ICD-10-CM

## 2015-02-05 DIAGNOSIS — M25562 Pain in left knee: Secondary | ICD-10-CM

## 2015-02-05 DIAGNOSIS — R269 Unspecified abnormalities of gait and mobility: Secondary | ICD-10-CM

## 2015-02-05 DIAGNOSIS — M7989 Other specified soft tissue disorders: Secondary | ICD-10-CM

## 2015-02-05 DIAGNOSIS — R29898 Other symptoms and signs involving the musculoskeletal system: Secondary | ICD-10-CM

## 2015-02-05 NOTE — Therapy (Signed)
[facility] Outpatient Rehabilitation [facility] [address]  [address] [place], [place], [address] Phone: [phone]   Fax:  [phone]  Physical Therapy Treatment  Patient Details  Name: Katherine Gilmore MRN: [id number] Date of Birth: 1970/11/19 Referring Provider:  Loreta Ave, MD  Encounter Date: 02/05/2015      PT End of Session - 02/05/15 1153    Visit Number 3   Number of Visits 12   Date for PT Re-Evaluation 03/11/15   PT Start Time 1104   PT Stop Time 1152   PT Time Calculation (min) 48 min   Activity Tolerance Patient tolerated treatment well   Behavior During Therapy [facility] for tasks assessed/performed      Past Medical History  Diagnosis Date  . Hypertension     not on BP meds currently    Past Surgical History  Procedure Laterality Date  . Breast reduction surgery    . Uterine fibroid surgery    . Cesarean section    . Hernia repair      as a baby  . Abdominal hysterectomy    . Total knee arthroplasty Left 01/06/2015    Procedure: TOTAL KNEE ARTHROPLASTY;  Surgeon: Mckinley Jewel, MD;  Location: [facility] OR;  Service: Orthopedics;  Laterality: Left;    There were no vitals filed for this visit.  Visit Diagnosis:  Stiffness of knee joint, left  Swelling of limb  Left knee pain  Abnormality of gait  Weakness of left leg      Subjective Assessment - 02/05/15 1107    Subjective States she traveled out of town for work this week. Used wheelchair to go between gates on layovers, but was doing more walking than usual. Felt ok back at work the 2 days following the trip.   Currently in Pain? No/denies            [facility] PT Assessment - 02/05/15 1104    ROM / Strength   AROM / PROM / Strength AROM   AROM   AROM Assessment Site Knee   Right/Left Knee Left   Left Knee Extension 8   Left Knee Flexion 106   PROM   PROM Assessment Site Knee   Right/Left Knee Left   Left Knee Extension 3   Left Knee Flexion 120                  [facility] Adult PT Treatment/Exercise - 02/05/15 1104    Exercises   Exercises Knee/Hip   Knee/Hip Exercises: Aerobic   Stationary Bike level 1x5'   Knee/Hip Exercises: Standing   Hip Flexion Both;AROM;10 reps;Knee bent   Hip Flexion Limitations Standing on blue foam, march, with 2 pole assist   Terminal Knee Extension Strengthening;Left;10 reps;Theraband   Theraband Level (Terminal Knee Extension) Level 4 (Blue)   Hip Abduction Both;AROM;10 reps;Knee straight   Abduction Limitations Standing on blue foam, alternating, with 2 pole assist   Hip Extension Both;AROM;10 reps;Knee straight   Extension Limitations Standing on blue foam, alternating, 2 pole A   Lateral Step Up Left;10 reps;Hand Hold: 0;Step Height: 6"   Forward Step Up Left;10 reps;Hand Hold: 0;Step Height: 6"   Knee/Hip Exercises: Seated   Hamstring Curl Left;10 reps   Hamstring Limitations Green TB   Knee/Hip Exercises: Supine   Short Arc Quad Sets Strengthening;Left;10 reps   Short Arc Quad Sets Limitations 2# squeezing ball btw knees with 5" hold   Heel Slides AAROM;Both;10 reps   Heel Slides  Limitations 3" hold with feet on peanut ball   Straight Leg Raises Strengthening;Left;5 reps;3 sets   Straight Leg Raises Limitations 2# with reps reduced to 5 per set before quad lag with fatigue   Manual Therapy   Manual Therapy Passive ROM;Soft tissue mobilization;Joint mobilization   Soft tissue mobilization Crossfriction to incision                PT Long Term Goals - 02/01/15 1012    PT LONG TERM GOAL #1   Title Independent with HEP (03/11/15)   Status On-going   PT LONG TERM GOAL #2   Title Left knee AROM 2-120 to allow for normal biomechanics with gait and stair climbing (03/11/15)   Status On-going   PT LONG TERM GOAL #3   Title Demo left LE strength 4+/5 or greater to improve function and stability (03/11/15)   Status On-going   PT LONG TERM GOAL #4   Title Ambulate with normal gait  pattern on all surfaces (03/11/15)   Status On-going   PT LONG TERM GOAL #5   Title Ascend/descend stairs with reciprocal gait pattern (03/11/15)   Status On-going               Plan - 02/05/15 1156    Clinical Impression Statement Left knee ROM improving but quad lag still evident especially with fatigue. Reports tolerating increased activity outside of therapy sesssions without increase in pain.   PT Next Visit Plan TKR protocol, modalities PRN   Consulted and Agree with Plan of Care Patient        Problem List Patient Active Problem List   Diagnosis Date Noted  . DJD (degenerative joint disease) of knee 01/06/2015    Marry Guan, PT, MPT 02/05/2015, 12:00 PM  [facility] [address]  [address] [place], [place], [address] Phone: [phone]   Fax:  [phone]

## 2015-02-08 ENCOUNTER — Ambulatory Visit: Payer: BLUE CROSS/BLUE SHIELD | Admitting: Rehabilitation

## 2015-02-08 DIAGNOSIS — M25662 Stiffness of left knee, not elsewhere classified: Secondary | ICD-10-CM

## 2015-02-08 DIAGNOSIS — M25562 Pain in left knee: Secondary | ICD-10-CM

## 2015-02-08 DIAGNOSIS — M7989 Other specified soft tissue disorders: Secondary | ICD-10-CM

## 2015-02-08 DIAGNOSIS — R269 Unspecified abnormalities of gait and mobility: Secondary | ICD-10-CM

## 2015-02-08 DIAGNOSIS — R29898 Other symptoms and signs involving the musculoskeletal system: Secondary | ICD-10-CM

## 2015-02-08 NOTE — Therapy (Signed)
[facility] Outpatient Rehabilitation [facility] [address]  [address] [place], [place], [address] Phone: [phone]   Fax:  [phone]  Physical Therapy Treatment  Patient Details  Name: Katherine Gilmore MRN: [id number] Date of Birth: June 10, 1971 Referring Provider:  Loreta Ave, MD  Encounter Date: 02/08/2015      PT End of Session - 02/08/15 1101    Visit Number 4   Number of Visits 12   Date for PT Re-Evaluation 03/11/15   PT Start Time 1100   PT Stop Time 1140   PT Time Calculation (min) 40 min   Activity Tolerance Patient tolerated treatment well   Behavior During Therapy [facility] for tasks assessed/performed      Past Medical History  Diagnosis Date  . Hypertension     not on BP meds currently    Past Surgical History  Procedure Laterality Date  . Breast reduction surgery    . Uterine fibroid surgery    . Cesarean section    . Hernia repair      as a baby  . Abdominal hysterectomy    . Total knee arthroplasty Left 01/06/2015    Procedure: TOTAL KNEE ARTHROPLASTY;  Surgeon: Mckinley Jewel, MD;  Location: [facility] OR;  Service: Orthopedics;  Laterality: Left;    There were no vitals filed for this visit.  Visit Diagnosis:  Stiffness of knee joint, left  Swelling of limb  Left knee pain  Abnormality of gait  Weakness of left leg      Subjective Assessment - 02/08/15 1100    Subjective Notes her knee is a little funky, stating that it is swollen and stiff today. Has noted that the stiffness/pain hasn't been occuring at night as much.   Currently in Pain? No/denies                         [facility] Adult PT Treatment/Exercise - 02/08/15 1102    Exercises   Exercises Knee/Hip   Knee/Hip Exercises: Aerobic   Stationary Bike level 2x5'   Knee/Hip Exercises: Machines for Strengthening   Cybex Leg Press DL 95# G38   Knee/Hip Exercises: Standing   Heel Raises Both;15 reps   Hip Flexion Both;AROM;10 reps;Knee bent   Hip  Flexion Limitations Standing on blue foam, march, 3 second hold   Hip Abduction Both;AROM;10 reps;Knee straight   Abduction Limitations Standing on blue foam, alternating, 3 second hold   Forward Step Up Left;10 reps;Hand Hold: 0;Step Height: 8"   Step Down Left;10 reps;Step Height: 6";Hand Hold: 2   Step Down Limitations Reach downs   Functional Squat 10 reps;3 seconds;2 sets   Functional Squat Limitations TRX   Knee/Hip Exercises: Seated   Other Seated Knee/Hip Exercises Fitter Leg Press 1 black/1 blue x15   Hamstring Curl Left;10 reps   Hamstring Limitations Green TB   Knee/Hip Exercises: Supine   Short Arc Quad Sets Strengthening;Left;10 reps;2 sets   Short Arc Quad Sets Limitations 2# with 5" hold   Heel Slides AAROM;Both;10 reps   Heel Slides Limitations 3" hold with feet on peanut ball   Terminal Knee Extension Left;10 reps   Terminal Knee Extension Limitations 3" hold with feet on peanut ball   Manual Therapy   Manual Therapy Passive ROM   Passive ROM Into flexion and extension to pt tolerance                     PT Long  Term Goals - 02/01/15 1012    PT LONG TERM GOAL #1   Title Independent with HEP (03/11/15)   Status On-going   PT LONG TERM GOAL #2   Title Left knee AROM 2-120 to allow for normal biomechanics with gait and stair climbing (03/11/15)   Status On-going   PT LONG TERM GOAL #3   Title Demo left LE strength 4+/5 or greater to improve function and stability (03/11/15)   Status On-going   PT LONG TERM GOAL #4   Title Ambulate with normal gait pattern on all surfaces (03/11/15)   Status On-going   PT LONG TERM GOAL #5   Title Ascend/descend stairs with reciprocal gait pattern (03/11/15)   Status On-going               Plan - 02/08/15 1131    Clinical Impression Statement Excellent tolerance to new exercises and increased reps without complaint of pain. Good, equal weightshft noted with TRX squats and pt being able to complete a deep squat.  Continues to noted soreness with hamstring stretching and PROM into extension.    PT Next Visit Plan TKR protocol, modalities PRN   Consulted and Agree with Plan of Care Patient        Problem List Patient Active Problem List   Diagnosis Date Noted  . DJD (degenerative joint disease) of knee 01/06/2015    Ronney Lion, PTA 02/08/2015, 11:41 AM  [facility] [address]  [address] [place], [place], [address] Phone: [phone]   Fax:  [phone]

## 2015-02-10 ENCOUNTER — Ambulatory Visit: Payer: BLUE CROSS/BLUE SHIELD | Admitting: Rehabilitation

## 2015-02-10 DIAGNOSIS — R29898 Other symptoms and signs involving the musculoskeletal system: Secondary | ICD-10-CM

## 2015-02-10 DIAGNOSIS — M7989 Other specified soft tissue disorders: Secondary | ICD-10-CM

## 2015-02-10 DIAGNOSIS — M25662 Stiffness of left knee, not elsewhere classified: Secondary | ICD-10-CM | POA: Diagnosis not present

## 2015-02-10 DIAGNOSIS — R269 Unspecified abnormalities of gait and mobility: Secondary | ICD-10-CM

## 2015-02-10 DIAGNOSIS — M25562 Pain in left knee: Secondary | ICD-10-CM

## 2015-02-10 NOTE — Therapy (Signed)
[facility] Outpatient Rehabilitation [facility] [address]  [address] [place], [place], [address] Phone: [phone]   Fax:  [phone]  Physical Therapy Treatment  Patient Details  Name: Katherine Gilmore MRN: [id number] Date of Birth: Nov 28, 1970 Referring Provider:  Loreta Ave, MD  Encounter Date: 02/10/2015      PT End of Session - 02/10/15 1100    Visit Number 5   Number of Visits 12   Date for PT Re-Evaluation 03/11/15   PT Start Time 1100   PT Stop Time 1138   PT Time Calculation (min) 38 min      Past Medical History  Diagnosis Date  . Hypertension     not on BP meds currently    Past Surgical History  Procedure Laterality Date  . Breast reduction surgery    . Uterine fibroid surgery    . Cesarean section    . Hernia repair      as a baby  . Abdominal hysterectomy    . Total knee arthroplasty Left 01/06/2015    Procedure: TOTAL KNEE ARTHROPLASTY;  Surgeon: Mckinley Jewel, MD;  Location: [facility] OR;  Service: Orthopedics;  Laterality: Left;    There were no vitals filed for this visit.  Visit Diagnosis:  Stiffness of knee joint, left  Swelling of limb  Left knee pain  Abnormality of gait  Weakness of left leg      Subjective Assessment - 02/10/15 1102    Subjective Noted some pain this morning and had to take a pain pill. States she didn't do too much/move alot yesterday.    Currently in Pain? Yes   Pain Score 2    Pain Location Knee   Pain Orientation Left            [facility] PT Assessment - 02/10/15 1120    ROM / Strength   AROM / PROM / Strength AROM   AROM   AROM Assessment Site Knee   Right/Left Knee Left   Left Knee Extension 4  8 degrees with SAQ   Left Knee Flexion 110                     [facility] Adult PT Treatment/Exercise - 02/10/15 1103    Exercises   Exercises Knee/Hip   Knee/Hip Exercises: Aerobic   Stationary Bike level 2x5'   Knee/Hip Exercises: Machines for Strengthening   Cybex Leg  Press DL 28# U13   Knee/Hip Exercises: Standing   Heel Raises Both;Left;15 reps;10 reps;2 sets  on edge of UBE   Heel Raises Limitations 1st set: DL K44, 2nd set Lt W10   Terminal Knee Extension Strengthening;Left;15 reps  5" hold   Terminal Knee Extension Limitations with small ball on wall   Forward Step Up Left;10 reps;Hand Hold: 0   Forward Step Up Limitations BOSU    Functional Squat 10 reps;3 seconds;2 sets   Functional Squat Limitations TRX   Knee/Hip Exercises: Seated   Other Seated Knee/Hip Exercises Fitter Leg Press 1 black/1 blue 2x15   Hamstring Curl Left;10 reps;2 sets   Hamstring Limitations Blue TB   Knee/Hip Exercises: Supine   Short Arc Quad Sets Strengthening;Left;2 sets;15 reps  with 5# hold   Short Arc Quad Sets Limitations 1st set with 2#, 2nd set with 3#   Straight Leg Raises Strengthening;Left;5 reps;3 sets   Straight Leg Raises Limitations 2# with reps  PT Long Term Goals - 02/01/15 1012    PT LONG TERM GOAL #1   Title Independent with HEP (03/11/15)   Status On-going   PT LONG TERM GOAL #2   Title Left knee AROM 2-120 to allow for normal biomechanics with gait and stair climbing (03/11/15)   Status On-going   PT LONG TERM GOAL #3   Title Demo left LE strength 4+/5 or greater to improve function and stability (03/11/15)   Status On-going   PT LONG TERM GOAL #4   Title Ambulate with normal gait pattern on all surfaces (03/11/15)   Status On-going   PT LONG TERM GOAL #5   Title Ascend/descend stairs with reciprocal gait pattern (03/11/15)   Status On-going               Plan - 02/10/15 1138    Clinical Impression Statement AROM measurements taken and both flexion and extension have improved. Took extension in supine and then in SAQ position with there being a 4 degree difference between the two. Excellent progress with all exercises today. Pt continues to have difficulty with SLR due to quad lag and had more  difficulty today, possibly due to performing other quad exercises first. Pt also had difficulty with SL heel raises on UBE. She was able to complete but needed to rest after a few reps.    PT Next Visit Plan TKR protocol, modalities PRN   Consulted and Agree with Plan of Care Patient        Problem List Patient Active Problem List   Diagnosis Date Noted  . DJD (degenerative joint disease) of knee 01/06/2015    Ronney Lion, PTA 02/10/2015, 11:41 AM  [facility] [address]  [address] [place], [place], [address] Phone: [phone]   Fax:  [phone]

## 2015-02-15 ENCOUNTER — Ambulatory Visit: Payer: BLUE CROSS/BLUE SHIELD | Admitting: Physical Therapy

## 2015-02-15 DIAGNOSIS — M25662 Stiffness of left knee, not elsewhere classified: Secondary | ICD-10-CM

## 2015-02-15 DIAGNOSIS — M25562 Pain in left knee: Secondary | ICD-10-CM

## 2015-02-15 DIAGNOSIS — R269 Unspecified abnormalities of gait and mobility: Secondary | ICD-10-CM

## 2015-02-15 DIAGNOSIS — R29898 Other symptoms and signs involving the musculoskeletal system: Secondary | ICD-10-CM

## 2015-02-15 DIAGNOSIS — M7989 Other specified soft tissue disorders: Secondary | ICD-10-CM

## 2015-02-15 NOTE — Therapy (Signed)
[facility] Outpatient Rehabilitation [facility] [address]  [address] [place], [place], [address] Phone: [phone]   Fax:  [phone]  Physical Therapy Treatment  Patient Details  Name: Katherine Gilmore MRN: [id number] Date of Birth: 12-20-1970 Referring Provider:  Loreta Ave, MD  Encounter Date: 02/15/2015      PT End of Session - 02/15/15 1147    Visit Number 6   Number of Visits 12   Date for PT Re-Evaluation 03/11/15   PT Start Time 1102   PT Stop Time 1145   PT Time Calculation (min) 43 min   Activity Tolerance Patient tolerated treatment well   Behavior During Therapy [facility] for tasks assessed/performed      Past Medical History  Diagnosis Date  . Hypertension     not on BP meds currently    Past Surgical History  Procedure Laterality Date  . Breast reduction surgery    . Uterine fibroid surgery    . Cesarean section    . Hernia repair      as a baby  . Abdominal hysterectomy    . 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  Visit Diagnosis:  Stiffness of knee joint, left  Swelling of limb  Left knee pain  Abnormality of gait  Weakness of left leg      Subjective Assessment - 02/15/15 1104    Subjective Reports knee was hurting really bad over the weekend ("off the scale") without know precipating event. Took it easy on Sunday and pain is better today.   Currently in Pain? Yes   Pain Score 3    Pain Location Knee   Pain Orientation Left            [facility] PT Assessment - 02/15/15 1102    ROM / Strength   AROM / PROM / Strength AROM;PROM   AROM   AROM Assessment Site Knee   Right/Left Knee Left   Left Knee Extension 4   Left Knee Flexion 115   PROM   PROM Assessment Site Knee   Right/Left Knee Left   Left Knee Extension 0   Left Knee  Flexion 124   Strength   Strength Assessment Site Hip;Knee   Right/Left Hip Left   Left Hip Flexion 4-/5   Left Hip Extension 4/5   Left Hip ABduction 4/5   Left Hip ADduction 4-/5   Right/Left Knee Left   Left Knee Flexion 4/5   Left Knee Extension 4/5                     [facility] Adult PT Treatment/Exercise - 02/15/15 1102    Exercises   Exercises Knee/Hip   Knee/Hip Exercises: Aerobic   Stationary Bike level 2x5'   Knee/Hip Exercises: Machines for Strengthening   Cybex Leg Press DL 69# G29  Focus on Left with Rt LE spotting Lt   Knee/Hip Exercises: Standing   Heel Raises Both;Left;15 reps;10 reps;2 sets  on edge of UBE   Heel Raises Limitations 1st set: DL B28, 2nd set Lt U13   Hip Flexion Stengthening;Left;Knee straight;15 reps   Hip Flexion Limitations Green TB with 2 pole A   Terminal Knee Extension Strengthening;Left;15 reps  5" hold   Theraband Level (Terminal Knee Extension) Level 4 (Blue)  Hip ADduction Strengthening;Left;15 reps   Hip ADduction Limitations Green TB with 2 pole A   Hip Abduction Stengthening;Left;Knee straight;15 reps   Abduction Limitations Green TB with 2 pole A   Hip Extension Stengthening;Left;15 reps;Knee straight   Extension Limitations Green TB with 2 pole A   Lateral Step Up Left;15 reps;Hand Hold: 2   Lateral Step Up Limitations BOSU with HHA on counter   Forward Step Up Left;15 reps;Hand Hold: 1   Forward Step Up Limitations BOSU with singel HHA on counter   Step Down Left;10 reps;Step Height: 6";Hand Hold: 2   Step Down Limitations Reach downs with 2 pole A   Functional Squat 15 reps;3 seconds;2 sets   Functional Squat Limitations TRX   Knee/Hip Exercises: Seated   Other Seated Knee/Hip Exercises Fitter Leg Press 1 black/1 blue 2x15   Hamstring Curl Left;2 sets;15 reps   Hamstring Limitations Blue TB                     PT Long Term Goals - 02/15/15 1148    PT LONG TERM GOAL #1   Title Independent with  HEP (03/11/15)   Status On-going   PT LONG TERM GOAL #2   Title Left knee AROM 2-120 to allow for normal biomechanics with gait and stair climbing (03/11/15)   Status On-going   PT LONG TERM GOAL #3   Title Demo left LE strength 4+/5 or greater to improve function and stability (03/11/15)   Status On-going   PT LONG TERM GOAL #4   Title Ambulate with normal gait pattern on all surfaces (03/11/15)   Status On-going   PT LONG TERM GOAL #5   Title Ascend/descend stairs with reciprocal gait pattern (03/11/15)   Status On-going               Plan - 02/15/15 1151    Clinical Impression Statement Patient now with full PROM into left knee extension but still lacking some active control at end range of motion. Left LE strength improving in hip and knee but still less than right leg. Continue to note swelling and increased temp in left knee but no signs of infection, most likely just inflammation. Patient declined ice/vaso at end of treatmet visit but encouraged continued use of ice and elevation at home.   PT Next Visit Plan TKR protocol, modalities PRN, MD note   Consulted and Agree with Plan of Care Patient        Problem List Patient Active Problem List   Diagnosis Date Noted  . DJD (degenerative joint disease) of knee 01/06/2015    Marry Guan, PT, MPT 02/15/2015, 12:00 PM  [facility] [address]  [address] [place], [place], [address] Phone: [phone]   Fax:  [phone]

## 2015-02-17 ENCOUNTER — Ambulatory Visit: Payer: BLUE CROSS/BLUE SHIELD | Admitting: Rehabilitation

## 2015-02-17 DIAGNOSIS — M7989 Other specified soft tissue disorders: Secondary | ICD-10-CM

## 2015-02-17 DIAGNOSIS — M25562 Pain in left knee: Secondary | ICD-10-CM

## 2015-02-17 DIAGNOSIS — R269 Unspecified abnormalities of gait and mobility: Secondary | ICD-10-CM

## 2015-02-17 DIAGNOSIS — M25662 Stiffness of left knee, not elsewhere classified: Secondary | ICD-10-CM

## 2015-02-17 DIAGNOSIS — R29898 Other symptoms and signs involving the musculoskeletal system: Secondary | ICD-10-CM

## 2015-02-17 NOTE — Therapy (Addendum)
[facility] [address]  [place], [place], [address] Phone: [phone]   Fax:  [phone]  Physical Therapy Treatment  Patient Details  Name: Katherine Gilmore MRN: [id number] Date of Birth: 06/05/1971 Referring Provider:  Ninetta Lights, MD  Encounter Date: 02/17/2015      PT End of Session - 02/17/15 1109    Visit Number 7   Number of Visits 12   Date for PT Re-Evaluation 03/11/15   PT Start Time 1105   PT Stop Time 1143   PT Time Calculation (min) 38 min   Activity Tolerance Patient tolerated treatment well   Behavior During Therapy [facility] for tasks assessed/performed      Past Medical History  Diagnosis Date  . Hypertension     not on BP meds currently    Past Surgical History  Procedure Laterality Date  . Breast reduction surgery    . Uterine fibroid surgery    . Cesarean section    . Hernia repair      as a baby  . Abdominal hysterectomy    . Total knee arthroplasty Left 01/06/2015    Procedure: TOTAL KNEE ARTHROPLASTY;  Surgeon: Kathryne Hitch, MD;  Location: [facility];  Service: Orthopedics;  Laterality: Left;    There were no vitals filed for this visit.  Visit Diagnosis:  Stiffness of knee joint, left  Swelling of limb  Left knee pain  Abnormality of gait  Weakness of left leg      Subjective Assessment - 02/17/15 1108    Subjective Denies pain today but notes her incision is really sensitive and ichy. States her best pain level the past week has been a 0/10, at worst a 10/10 (over the weekend for unknown reason), and 1-2/10 on average.    Currently in Pain? No/denies            [facility] PT Assessment - 02/17/15 1131    ROM / Strength   AROM / PROM / Strength AROM;Strength   AROM   AROM Assessment Site Knee   Right/Left Knee Left   Left Knee Extension 4   Left Knee Flexion 115   PROM   PROM Assessment Site Knee   Right/Left Knee Left   Left Knee Extension 0   Left Knee  Flexion 124   Strength   Strength Assessment Site Knee   Left Knee Flexion 4/5   Left Knee Extension 4/5                     [facility] Adult PT Treatment/Exercise - 02/17/15 1110    Exercises   Exercises Knee/Hip   Knee/Hip Exercises: Aerobic   Stationary Bike level 2x6'   Knee/Hip Exercises: Machines for Strengthening   Cybex Knee Extension DL 15# x15   Cybex Knee Flexion DL 25# x15   Cybex Leg Press Lt 25# x20   Rt LE spotting   Knee/Hip Exercises: Standing   Heel Raises Both;Left;15 reps;10 reps;2 sets  on the edge of UBE   Heel Raises Limitations 1st set: DL x15, 2nd set Lt x10   Terminal Knee Extension Strengthening;Left;15 reps  5" hold   Terminal Knee Extension Limitations with small badd on wall   Forward Step Up Left;15 reps;Hand Hold: 1   Forward Step Up Limitations BOSU with single HHA on counter   Step Down Left;10 reps;Step Height: 6";Hand Hold: 2   Step Down Limitations Reach downs with HHA on counter  Functional Squat 15 reps;3 seconds;2 sets   Functional Squat Limitations TRX   Other Standing Knee Exercises TM Hamstring pull    Knee/Hip Exercises: Seated   Other Seated Knee/Hip Exercises Fitter Leg Press 1 black/1 blue 2x15   Knee/Hip Exercises: Supine   Bridges with Ball Squeeze Both;15 reps  5" hold                     PT Long Term Goals - 02/15/15 1148    PT LONG TERM GOAL #1   Title Independent with HEP (03/11/15)   Status On-going   PT LONG TERM GOAL #2   Title Left knee AROM 2-120 to allow for normal biomechanics with gait and stair climbing (03/11/15)   Status On-going   PT LONG TERM GOAL #3   Title Demo left LE strength 4+/5 or greater to improve function and stability (03/11/15)   Status On-going   PT LONG TERM GOAL #4   Title Ambulate with normal gait pattern on all surfaces (03/11/15)   Status On-going   PT LONG TERM GOAL #5   Title Ascend/descend stairs with reciprocal gait pattern (03/11/15)   Status On-going                Plan - 02/17/15 1143    Clinical Impression Statement Good tolerance to new exercises without complaint of pain. Will increase weight machines at next visit to pt tolerance and try SL as able. Overall, pt has been progressing well with ROM and strength continuing to improve. Still lacking TKE with gait and extension exercises but that is slowly improving. Knee Flexion ROM is excellent and strength is slowly progressing with knee and hip.    PT Next Visit Plan TKR protocol, modalities PRN   Consulted and Agree with Plan of Care Patient        Problem List Patient Active Problem List   Diagnosis Date Noted  . DJD (degenerative joint disease) of knee 01/06/2015    Barbette Hair, PTA 02/17/2015, 11:45 AM  [facility] [address]  [place], [place], [address] Phone: [phone]   Fax:  [phone]     PHYSICAL THERAPY DISCHARGE SUMMARY  Visits from Start of Care: 7  Current functional level related to goals / functional outcomes:  Patient called on 02/25/15 stating she saw MD on 02/23/15 who was pleased with patient's progress and told her she could stop PT. Left knee AROM 4-115 and PROM 0-124 with strength 4/5. Patient still lacking TKE with gait as of last therapy visit. Stairs not recently assessed. Goals not fully met secondary to early discharge per MD   Remaining deficits:  Patient lacking full AROM extension with limited TKE during gait.   Education / Equipment:  HEP Plan: Patient agrees to discharge.  Patient goals were partially met. Patient is being discharged due to the physician's request.  ?????       Percival Spanish, PT, MPT 03/03/2015, 10:01 AM  [facility] [place], [place], [address] Phone: [phone]   Fax:  [phone]

## 2015-02-23 ENCOUNTER — Ambulatory Visit: Payer: BLUE CROSS/BLUE SHIELD | Attending: Orthopedic Surgery | Admitting: Rehabilitation

## 2015-02-25 ENCOUNTER — Ambulatory Visit: Payer: BLUE CROSS/BLUE SHIELD | Admitting: Physical Therapy

## 2015-03-01 ENCOUNTER — Ambulatory Visit: Payer: BLUE CROSS/BLUE SHIELD | Admitting: Physical Therapy

## 2015-03-03 ENCOUNTER — Ambulatory Visit: Payer: BLUE CROSS/BLUE SHIELD | Admitting: Rehabilitation

## 2015-03-08 ENCOUNTER — Ambulatory Visit: Payer: BLUE CROSS/BLUE SHIELD | Admitting: Physical Therapy

## 2015-03-09 ENCOUNTER — Other Ambulatory Visit: Payer: Self-pay | Admitting: Physician Assistant

## 2015-03-09 NOTE — H&P (Signed)
TOTAL KNEE ADMISSION H&P  Patient is being admitted for right total knee arthroplasty.  Subjective:  Chief Complaint:right knee pain.  HPI: Katherine Gilmore, 44 y.o. female, has a history of pain and functional disability in the right knee due to arthritis and has failed non-surgical conservative treatments for greater than 12 weeks to includeNSAID's and/or analgesics, corticosteriod injections and viscosupplementation injections.  Onset of symptoms was gradual, starting 2 years ago with rapidlly worsening course since that time. The patient noted no past surgery on the right knee(s).  Patient currently rates pain in the right knee(s) at 5 out of 10 with activity. Patient has night pain, worsening of pain with activity and weight bearing and crepitus.  Patient has evidence of subchondral sclerosis and joint space narrowing by imaging studies. There is no active infection.  Patient Active Problem List   Diagnosis Date Noted  . DJD (degenerative joint disease) of knee 01/06/2015   Past Medical History  Diagnosis Date  . Hypertension     not on BP meds currently    Past Surgical History  Procedure Laterality Date  . Breast reduction surgery    . Uterine fibroid surgery    . Cesarean section    . Hernia repair      as a baby  . Abdominal hysterectomy    . Total knee arthroplasty Left 01/06/2015    Procedure: TOTAL KNEE ARTHROPLASTY;  Surgeon: Mckinley Jewel, MD;  Location: [facility] OR;  Service: Orthopedics;  Laterality: Left;     (Not in a hospital admission) No Known Allergies  Social History  Substance Use Topics  . Smoking status: Never Smoker   . Smokeless tobacco: Not on file  . Alcohol Use: 0.6 oz/week    1 Glasses of wine per week     Comment: 2-3 nights a week    No family history on file.   Review of Systems  Constitutional: Negative.   HENT: Negative.   Eyes: Negative.   Respiratory: Negative.   Cardiovascular: Negative.   Gastrointestinal: Negative.   Genitourinary:  Negative.   Musculoskeletal: Positive for joint pain.  Skin: Negative.   Neurological: Negative.   Endo/Heme/Allergies: Negative.   Psychiatric/Behavioral: Negative.     Objective:  Physical Exam  Constitutional: She is oriented to person, place, and time. She appears well-developed and well-nourished.  HENT:  Head: Normocephalic and atraumatic.  Eyes: EOM are normal. Pupils are equal, round, and reactive to light.  Neck: Normal range of motion. Neck supple.  Cardiovascular: Normal rate and regular rhythm.   Respiratory: Effort normal and breath sounds normal.  GI: Soft. Bowel sounds are normal.  Neurological: She is alert and oriented to person, place, and time.  Skin: Skin is warm and dry.  Psychiatric: She has a normal mood and affect. Her behavior is normal. Judgment and thought content normal.    Vital signs in last 24 hours: @  Labs:   Estimated body mass index is 36.71 kg/(m^2) as calculated from the following:   Height as of 01/06/15: 5' 6.6" (1.692 m).   Weight as of 12/25/14: 105.101 kg (231 lb 11.3 oz).   Imaging Review Plain radiographs demonstrate severe degenerative joint disease of the right knee(s). The overall alignment ismild valgus. The bone quality appears to be fair for age and reported activity level.  Assessment/Plan:  End stage arthritis, right knee   The patient history, physical examination, clinical judgment of the provider and imaging studies are consistent with end stage degenerative joint disease of  the right knee(s) and total knee arthroplasty is deemed medically necessary. The treatment options including medical management, injection therapy arthroscopy and arthroplasty were discussed at length. The risks and benefits of total knee arthroplasty were presented and reviewed. The risks due to aseptic loosening, infection, stiffness, patella tracking problems, thromboembolic complications and other imponderables were discussed. The patient  acknowledged the explanation, agreed to proceed with the plan and consent was signed. Patient is being admitted for inpatient treatment for surgery, pain control, PT, OT, prophylactic antibiotics, VTE prophylaxis, progressive ambulation and ADL's and discharge planning. The patient is planning to be discharged home with home health services   

## 2015-03-10 ENCOUNTER — Ambulatory Visit: Payer: BLUE CROSS/BLUE SHIELD | Admitting: Physical Therapy

## 2015-03-11 ENCOUNTER — Encounter (HOSPITAL_COMMUNITY): Payer: Self-pay

## 2015-03-11 ENCOUNTER — Encounter (HOSPITAL_COMMUNITY)
Admission: RE | Admit: 2015-03-11 | Discharge: 2015-03-11 | Disposition: A | Discharge status: Home / Self Care | Payer: BLUE CROSS/BLUE SHIELD | Source: Ambulatory Visit | Attending: Orthopedic Surgery | Admitting: Orthopedic Surgery

## 2015-03-11 DIAGNOSIS — Z01812 Encounter for preprocedural laboratory examination: Secondary | ICD-10-CM | POA: Insufficient documentation

## 2015-03-11 DIAGNOSIS — Z0183 Encounter for blood typing: Secondary | ICD-10-CM | POA: Diagnosis not present

## 2015-03-11 DIAGNOSIS — M1711 Unilateral primary osteoarthritis, right knee: Secondary | ICD-10-CM | POA: Insufficient documentation

## 2015-03-11 LAB — COMPREHENSIVE METABOLIC PANEL
ALBUMIN: 4 g/dL (ref 3.5–5.0)
ALK PHOS: 84 U/L (ref 38–126)
ALT: 17 U/L (ref 14–54)
ANION GAP: 8 (ref 5–15)
AST: 19 U/L (ref 15–41)
BILIRUBIN TOTAL: 0.6 mg/dL (ref 0.3–1.2)
BUN: 12 mg/dL (ref 6–20)
CALCIUM: 9.7 mg/dL (ref 8.9–10.3)
CO2: 26 mmol/L (ref 22–32)
Chloride: 106 mmol/L (ref 101–111)
Creatinine, Ser: 0.9 mg/dL (ref 0.44–1.00)
GFR calc Af Amer: >60 mL/min (ref >60)
GLUCOSE: 81 mg/dL (ref 65–99)
Potassium: 3.6 mmol/L (ref 3.5–5.1)
Sodium: 140 mmol/L (ref 135–145)
TOTAL PROTEIN: 7.1 g/dL (ref 6.5–8.1)

## 2015-03-11 LAB — CBC WITH DIFFERENTIAL/PLATELET
BASOS PCT: 1 %
Basophils Absolute: 0.1 10*3/uL (ref 0.0–0.1)
Eosinophils Absolute: 0.1 10*3/uL (ref 0.0–0.7)
Eosinophils Relative: 1 %
HEMATOCRIT: 39.6 % (ref 36.0–46.0)
HEMOGLOBIN: 12.8 g/dL (ref 12.0–15.0)
LYMPHS ABS: 2.5 10*3/uL (ref 0.7–4.0)
LYMPHS PCT: 36 %
MCH: 26.7 pg (ref 26.0–34.0)
MCHC: 32.3 g/dL (ref 30.0–36.0)
MCV: 82.7 fL (ref 78.0–100.0)
MONO ABS: 0.4 10*3/uL (ref 0.1–1.0)
MONOS PCT: 6 %
NEUTROS ABS: 4 10*3/uL (ref 1.7–7.7)
NEUTROS PCT: 56 %
Platelets: 319 10*3/uL (ref 150–400)
RBC: 4.79 MIL/uL (ref 3.87–5.11)
RDW: 13.7 % (ref 11.5–15.5)
WBC: 7 10*3/uL (ref 4.0–10.5)

## 2015-03-11 LAB — PROTIME-INR
INR: 1.12 (ref 0.00–1.49)
PROTHROMBIN TIME: 14.6 s (ref 11.6–15.2)

## 2015-03-11 LAB — SURGICAL PCR SCREEN
MRSA, PCR: NEGATIVE
STAPHYLOCOCCUS AUREUS: NEGATIVE

## 2015-03-11 LAB — TYPE AND SCREEN
ABO/RH(D): O POS
Antibody Screen: NEGATIVE

## 2015-03-11 LAB — APTT: aPTT: 33 seconds (ref 24–37)

## 2015-03-11 MED ORDER — CHLORHEXIDINE GLUCONATE 4 % EX LIQD: 60.0000 mL | Freq: Once | CUTANEOUS | Status: DC

## 2015-03-11 NOTE — Pre-Procedure Instructions (Signed)
    JAZILYN SIEGENTHALER  03/11/2015      [id number] [place], [place] - [address] [place] [address] Phone: [phone] Fax: [phone]  [id number] - [place] - [address] [place] [address] Phone: [phone] Fax: [phone]    Your procedure is scheduled on 03/24/15.  Report to [facility] Admitting at 930 A.M.  Call this number if you have problems the morning of surgery:  [phone]   Remember:  Do not eat food or drink liquids after midnight.  Take these medicines the morning of surgery with A SIP OF WATER --oxycodone   Do not wear jewelry, make-up or nail polish.  Do not wear lotions, powders, or perfumes.  You may wear deodorant.  Do not shave 48 hours prior to surgery.  Men may shave face and neck.  Do not bring valuables to the hospital.  [facility] is not responsible for any belongings or valuables.  Contacts, dentures or bridgework may not be worn into surgery.  Leave your suitcase in the car.  After surgery it may be brought to your room.  For patients admitted to the hospital, discharge time will be determined by your treatment team.  Patients discharged the day of surgery will not be allowed to drive home.   Name and phone number of your driver:   Special instructions:    Please read over the following fact sheets that you were given. Pain Booklet, Coughing and Deep Breathing, Blood Transfusion Information, MRSA Information and Surgical Site Infection Prevention

## 2015-03-12 LAB — URINE CULTURE

## 2015-03-23 MED ORDER — CEFAZOLIN SODIUM-DEXTROSE 2-3 GM-% IV SOLR
2.0000 g | INTRAVENOUS | Status: AC
  Administered 2015-03-24: 2 g via INTRAVENOUS
  Filled 2015-03-23: qty 50

## 2015-03-23 MED ORDER — LACTATED RINGERS IV SOLN
INTRAVENOUS | Status: DC
  Administered 2015-03-24 (×2): via INTRAVENOUS

## 2015-03-24 ENCOUNTER — Encounter (HOSPITAL_COMMUNITY)
Admission: RE | Disposition: A | Discharge status: Home Health | Payer: Self-pay | Source: Ambulatory Visit | Attending: Orthopedic Surgery

## 2015-03-24 ENCOUNTER — Encounter (HOSPITAL_COMMUNITY): Payer: Self-pay | Admitting: *Deleted

## 2015-03-24 ENCOUNTER — Inpatient Hospital Stay (HOSPITAL_COMMUNITY): Payer: BLUE CROSS/BLUE SHIELD | Admitting: Anesthesiology

## 2015-03-24 ENCOUNTER — Inpatient Hospital Stay (HOSPITAL_COMMUNITY)
Admission: RE | Admit: 2015-03-24 | Discharge: 2015-03-26 | DRG: 470 | Disposition: A | Discharge status: Home Health | Payer: BLUE CROSS/BLUE SHIELD | Source: Ambulatory Visit | Attending: Orthopedic Surgery | Admitting: Orthopedic Surgery

## 2015-03-24 ENCOUNTER — Inpatient Hospital Stay (HOSPITAL_COMMUNITY): Payer: BLUE CROSS/BLUE SHIELD

## 2015-03-24 DIAGNOSIS — Z96659 Presence of unspecified artificial knee joint: Secondary | ICD-10-CM

## 2015-03-24 DIAGNOSIS — M1710 Unilateral primary osteoarthritis, unspecified knee: Secondary | ICD-10-CM | POA: Diagnosis present

## 2015-03-24 DIAGNOSIS — Z96652 Presence of left artificial knee joint: Secondary | ICD-10-CM | POA: Diagnosis present

## 2015-03-24 DIAGNOSIS — Z419 Encounter for procedure for purposes other than remedying health state, unspecified: Secondary | ICD-10-CM

## 2015-03-24 DIAGNOSIS — Z6836 Body mass index (BMI) 36.0-36.9, adult: Secondary | ICD-10-CM | POA: Diagnosis not present

## 2015-03-24 DIAGNOSIS — I10 Essential (primary) hypertension: Secondary | ICD-10-CM | POA: Diagnosis present

## 2015-03-24 DIAGNOSIS — M25561 Pain in right knee: Secondary | ICD-10-CM | POA: Diagnosis present

## 2015-03-24 DIAGNOSIS — D62 Acute posthemorrhagic anemia: Secondary | ICD-10-CM | POA: Diagnosis not present

## 2015-03-24 DIAGNOSIS — M1711 Unilateral primary osteoarthritis, right knee: Secondary | ICD-10-CM | POA: Diagnosis present

## 2015-03-24 DIAGNOSIS — M179 Osteoarthritis of knee, unspecified: Secondary | ICD-10-CM | POA: Diagnosis present

## 2015-03-24 DIAGNOSIS — S80251A Superficial foreign body, right knee, initial encounter: Secondary | ICD-10-CM

## 2015-03-24 HISTORY — PX: TOTAL KNEE ARTHROPLASTY: SHX125

## 2015-03-24 SURGERY — ARTHROPLASTY, KNEE, TOTAL
Anesthesia: General | Site: Knee | Laterality: Right

## 2015-03-24 MED ORDER — DOCUSATE SODIUM 100 MG PO CAPS
100.0000 mg | ORAL_CAPSULE | Freq: Two times a day (BID) | ORAL | Status: DC
  Administered 2015-03-24 – 2015-03-26 (×4): 100 mg via ORAL
  Filled 2015-03-24 (×4): qty 1

## 2015-03-24 MED ORDER — BUPIVACAINE LIPOSOME 1.3 % IJ SUSP
20.0000 mL | INTRAMUSCULAR | Status: DC
  Filled 2015-03-24: qty 20

## 2015-03-24 MED ORDER — ZOLPIDEM TARTRATE 5 MG PO TABS: 5.0000 mg | ORAL_TABLET | Freq: Every evening | ORAL | Status: DC | PRN

## 2015-03-24 MED ORDER — FENTANYL CITRATE (PF) 250 MCG/5ML IJ SOLN
INTRAMUSCULAR | Status: DC | PRN
  Administered 2015-03-24: 50 ug via INTRAVENOUS
  Administered 2015-03-24: 150 ug via INTRAVENOUS
  Administered 2015-03-24: 50 ug via INTRAVENOUS

## 2015-03-24 MED ORDER — OXYCODONE-ACETAMINOPHEN 5-325 MG PO TABS: 1.0000 | ORAL_TABLET | ORAL | Status: AC | PRN

## 2015-03-24 MED ORDER — OXYCODONE HCL 5 MG PO TABS
5.0000 mg | ORAL_TABLET | ORAL | Status: DC | PRN
  Administered 2015-03-24 – 2015-03-26 (×6): 10 mg via ORAL
  Filled 2015-03-24 (×6): qty 2

## 2015-03-24 MED ORDER — HYDROMORPHONE HCL 1 MG/ML IJ SOLN
INTRAMUSCULAR | Status: AC
  Filled 2015-03-24: qty 1

## 2015-03-24 MED ORDER — GLYCOPYRROLATE 0.2 MG/ML IJ SOLN
INTRAMUSCULAR | Status: DC | PRN
  Administered 2015-03-24 (×2): 0.1 mg via INTRAVENOUS

## 2015-03-24 MED ORDER — BUPIVACAINE HCL (PF) 0.5 % IJ SOLN
INTRAMUSCULAR | Status: AC
  Filled 2015-03-24: qty 10

## 2015-03-24 MED ORDER — HYDROMORPHONE HCL 1 MG/ML IJ SOLN
0.5000 mg | INTRAMUSCULAR | Status: DC | PRN
  Administered 2015-03-24: 1 mg via INTRAVENOUS
  Administered 2015-03-25: 0.5 mg via INTRAVENOUS
  Filled 2015-03-24: qty 1

## 2015-03-24 MED ORDER — OXYCODONE HCL 5 MG PO TABS
ORAL_TABLET | ORAL | Status: AC
  Filled 2015-03-24: qty 2

## 2015-03-24 MED ORDER — SODIUM CHLORIDE 0.9 % IJ SOLN
INTRAMUSCULAR | Status: DC | PRN
  Administered 2015-03-24: 40 mL

## 2015-03-24 MED ORDER — ONDANSETRON HCL 4 MG PO TABS: 4.0000 mg | ORAL_TABLET | Freq: Three times a day (TID) | ORAL | Status: AC | PRN

## 2015-03-24 MED ORDER — METHOCARBAMOL 500 MG PO TABS: 500.0000 mg | ORAL_TABLET | Freq: Four times a day (QID) | ORAL | Status: AC | PRN

## 2015-03-24 MED ORDER — DEXAMETHASONE SODIUM PHOSPHATE 4 MG/ML IJ SOLN
INTRAMUSCULAR | Status: DC | PRN
  Administered 2015-03-24: 4 mg via INTRAVENOUS

## 2015-03-24 MED ORDER — ONDANSETRON HCL 4 MG/2ML IJ SOLN
INTRAMUSCULAR | Status: AC
  Filled 2015-03-24: qty 2

## 2015-03-24 MED ORDER — SUCCINYLCHOLINE 20MG/ML (10ML) SYRINGE FOR MEDFUSION PUMP - OPTIME
INTRAMUSCULAR | Status: DC | PRN
  Administered 2015-03-24: 120 mg via INTRAVENOUS

## 2015-03-24 MED ORDER — ALUM & MAG HYDROXIDE-SIMETH 200-200-20 MG/5ML PO SUSP: 30.0000 mL | ORAL | Status: DC | PRN

## 2015-03-24 MED ORDER — LIDOCAINE HCL (CARDIAC) 20 MG/ML IV SOLN
INTRAVENOUS | Status: AC
  Filled 2015-03-24: qty 5

## 2015-03-24 MED ORDER — GLYCOPYRROLATE 0.2 MG/ML IJ SOLN
INTRAMUSCULAR | Status: AC
  Filled 2015-03-24: qty 1

## 2015-03-24 MED ORDER — PROPOFOL 10 MG/ML IV BOLUS
INTRAVENOUS | Status: AC
  Filled 2015-03-24: qty 20

## 2015-03-24 MED ORDER — MIDAZOLAM HCL 2 MG/2ML IJ SOLN
INTRAMUSCULAR | Status: AC
  Filled 2015-03-24: qty 4

## 2015-03-24 MED ORDER — BISACODYL 5 MG PO TBEC: 5.0000 mg | DELAYED_RELEASE_TABLET | Freq: Every day | ORAL | Status: DC | PRN

## 2015-03-24 MED ORDER — BUPIVACAINE LIPOSOME 1.3 % IJ SUSP
INTRAMUSCULAR | Status: DC | PRN
  Administered 2015-03-24: 20 mL

## 2015-03-24 MED ORDER — METHOCARBAMOL 1000 MG/10ML IJ SOLN
500.0000 mg | INTRAVENOUS | Status: AC
  Administered 2015-03-24: 500 mg via INTRAVENOUS
  Filled 2015-03-24: qty 5

## 2015-03-24 MED ORDER — ARTIFICIAL TEARS OP OINT
TOPICAL_OINTMENT | OPHTHALMIC | Status: AC
  Filled 2015-03-24: qty 3.5

## 2015-03-24 MED ORDER — BISACODYL 5 MG PO TBEC: 5.0000 mg | DELAYED_RELEASE_TABLET | Freq: Every day | ORAL | Status: AC | PRN

## 2015-03-24 MED ORDER — MIDAZOLAM HCL 2 MG/2ML IJ SOLN
INTRAMUSCULAR | Status: DC | PRN
  Administered 2015-03-24: 2 mg via INTRAVENOUS

## 2015-03-24 MED ORDER — ACETAMINOPHEN 650 MG RE SUPP: 650.0000 mg | Freq: Four times a day (QID) | RECTAL | Status: DC | PRN

## 2015-03-24 MED ORDER — MENTHOL 3 MG MT LOZG: 1.0000 | LOZENGE | OROMUCOSAL | Status: DC | PRN

## 2015-03-24 MED ORDER — DEXAMETHASONE SODIUM PHOSPHATE 10 MG/ML IJ SOLN
10.0000 mg | Freq: Once | INTRAMUSCULAR | Status: AC
  Administered 2015-03-25: 10 mg via INTRAVENOUS
  Filled 2015-03-24: qty 1

## 2015-03-24 MED ORDER — PROPOFOL 10 MG/ML IV BOLUS
INTRAVENOUS | Status: DC | PRN
  Administered 2015-03-24: 150 mg via INTRAVENOUS
  Administered 2015-03-24: 50 mg via INTRAVENOUS

## 2015-03-24 MED ORDER — MAGNESIUM CITRATE PO SOLN: 1.0000 | Freq: Once | ORAL | Status: DC | PRN

## 2015-03-24 MED ORDER — METOCLOPRAMIDE HCL 5 MG/ML IJ SOLN: 5.0000 mg | Freq: Three times a day (TID) | INTRAMUSCULAR | Status: DC | PRN

## 2015-03-24 MED ORDER — PHENOL 1.4 % MT LIQD: 1.0000 | OROMUCOSAL | Status: DC | PRN

## 2015-03-24 MED ORDER — SENNOSIDES-DOCUSATE SODIUM 8.6-50 MG PO TABS: 1.0000 | ORAL_TABLET | Freq: Every evening | ORAL | Status: DC | PRN

## 2015-03-24 MED ORDER — SODIUM CHLORIDE 0.9 % IR SOLN
Status: DC | PRN
  Administered 2015-03-24: 1000 mL

## 2015-03-24 MED ORDER — PROMETHAZINE HCL 25 MG/ML IJ SOLN: 6.2500 mg | INTRAMUSCULAR | Status: DC | PRN

## 2015-03-24 MED ORDER — METOCLOPRAMIDE HCL 5 MG PO TABS: 5.0000 mg | ORAL_TABLET | Freq: Three times a day (TID) | ORAL | Status: DC | PRN

## 2015-03-24 MED ORDER — ACETAMINOPHEN 325 MG PO TABS: 650.0000 mg | ORAL_TABLET | Freq: Four times a day (QID) | ORAL | Status: DC | PRN

## 2015-03-24 MED ORDER — DEXAMETHASONE SODIUM PHOSPHATE 4 MG/ML IJ SOLN
INTRAMUSCULAR | Status: AC
  Filled 2015-03-24: qty 1

## 2015-03-24 MED ORDER — BUPIVACAINE HCL 0.5 % IJ SOLN
INTRAMUSCULAR | Status: DC | PRN
Start: 2015-03-24 — End: 2015-03-24
  Administered 2015-03-24: 10 mL

## 2015-03-24 MED ORDER — ARTIFICIAL TEARS OP OINT
TOPICAL_OINTMENT | OPHTHALMIC | Status: DC | PRN
  Administered 2015-03-24: 1 via OPHTHALMIC

## 2015-03-24 MED ORDER — APIXABAN 2.5 MG PO TABS
2.5000 mg | ORAL_TABLET | Freq: Two times a day (BID) | ORAL | Status: DC
  Administered 2015-03-25 – 2015-03-26 (×3): 2.5 mg via ORAL
  Filled 2015-03-24 (×3): qty 1

## 2015-03-24 MED ORDER — HYDROMORPHONE HCL 1 MG/ML IJ SOLN
0.2500 mg | INTRAMUSCULAR | Status: AC | PRN
  Administered 2015-03-24 (×2): .2 mg via INTRAVENOUS
  Administered 2015-03-24: 0.5 mg via INTRAVENOUS
  Administered 2015-03-24 (×2): .2 mg via INTRAVENOUS
  Administered 2015-03-24 (×2): 0.5 mg via INTRAVENOUS
  Administered 2015-03-24: .2 mg via INTRAVENOUS

## 2015-03-24 MED ORDER — CELECOXIB 200 MG PO CAPS
200.0000 mg | ORAL_CAPSULE | Freq: Two times a day (BID) | ORAL | Status: DC
  Administered 2015-03-24 – 2015-03-26 (×4): 200 mg via ORAL
  Filled 2015-03-24 (×4): qty 1

## 2015-03-24 MED ORDER — ONDANSETRON HCL 4 MG PO TABS: 4.0000 mg | ORAL_TABLET | Freq: Four times a day (QID) | ORAL | Status: DC | PRN

## 2015-03-24 MED ORDER — METHOCARBAMOL 500 MG PO TABS
500.0000 mg | ORAL_TABLET | Freq: Four times a day (QID) | ORAL | Status: DC | PRN
  Administered 2015-03-24 – 2015-03-25 (×2): 500 mg via ORAL
  Filled 2015-03-24 (×3): qty 1

## 2015-03-24 MED ORDER — POTASSIUM CHLORIDE IN NACL 20-0.9 MEQ/L-% IV SOLN: INTRAVENOUS | Status: DC

## 2015-03-24 MED ORDER — ONDANSETRON HCL 4 MG/2ML IJ SOLN
INTRAMUSCULAR | Status: DC | PRN
  Administered 2015-03-24: 4 mg via INTRAVENOUS

## 2015-03-24 MED ORDER — FENTANYL CITRATE (PF) 250 MCG/5ML IJ SOLN
INTRAMUSCULAR | Status: AC
  Filled 2015-03-24: qty 5

## 2015-03-24 MED ORDER — ONDANSETRON HCL 4 MG/2ML IJ SOLN: 4.0000 mg | Freq: Four times a day (QID) | INTRAMUSCULAR | Status: DC | PRN

## 2015-03-24 MED ORDER — LIDOCAINE HCL (CARDIAC) 20 MG/ML IV SOLN
INTRAVENOUS | Status: DC | PRN
  Administered 2015-03-24: 60 mg via INTRAVENOUS

## 2015-03-24 MED ORDER — METHOCARBAMOL 1000 MG/10ML IJ SOLN
500.0000 mg | Freq: Four times a day (QID) | INTRAVENOUS | Status: DC | PRN
  Filled 2015-03-24: qty 5

## 2015-03-24 MED ORDER — APIXABAN 2.5 MG PO TABS: ORAL_TABLET | ORAL | Status: AC

## 2015-03-24 MED ORDER — PROPOFOL 500 MG/50ML IV EMUL
INTRAVENOUS | Status: DC | PRN
  Administered 2015-03-24: 25 ug/kg/min via INTRAVENOUS

## 2015-03-24 MED ORDER — DIPHENHYDRAMINE HCL 12.5 MG/5ML PO ELIX: 12.5000 mg | ORAL_SOLUTION | ORAL | Status: DC | PRN

## 2015-03-24 SURGICAL SUPPLY — 64 items
BANDAGE ELASTIC 4 VELCRO ST LF (GAUZE/BANDAGES/DRESSINGS) ×3 IMPLANT
BANDAGE ELASTIC 6 VELCRO ST LF (GAUZE/BANDAGES/DRESSINGS) ×3 IMPLANT
BANDAGE ESMARK 6X9 LF (GAUZE/BANDAGES/DRESSINGS) ×1 IMPLANT
BENZOIN TINCTURE PRP APPL 2/3 (GAUZE/BANDAGES/DRESSINGS) ×3 IMPLANT
BLADE SAG 18X100X1.27 (BLADE) ×6 IMPLANT
BNDG ESMARK 6X9 LF (GAUZE/BANDAGES/DRESSINGS) ×3
BOWL SMART MIX CTS (DISPOSABLE) ×3 IMPLANT
CAPT KNEE TOTAL 3 ×3 IMPLANT
CEMENT BONE SIMPLEX SPEEDSET (Cement) ×6 IMPLANT
CLOSURE WOUND 1/2 X4 (GAUZE/BANDAGES/DRESSINGS) ×1
COVER SURGICAL LIGHT HANDLE (MISCELLANEOUS) ×3 IMPLANT
CUFF TOURNIQUET SINGLE 34IN LL (TOURNIQUET CUFF) ×3 IMPLANT
DRAPE EXTREMITY T 121X128X90 (DRAPE) ×3 IMPLANT
DRAPE IMP U-DRAPE 54X76 (DRAPES) ×3 IMPLANT
DRAPE PROXIMA HALF (DRAPES) ×3 IMPLANT
DRAPE U-SHAPE 47X51 STRL (DRAPES) ×3 IMPLANT
DRSG PAD ABDOMINAL 8X10 ST (GAUZE/BANDAGES/DRESSINGS) ×3 IMPLANT
DURAPREP 26ML APPLICATOR (WOUND CARE) ×6 IMPLANT
ELECT CAUTERY BLADE 6.4 (BLADE) ×3 IMPLANT
ELECT REM PT RETURN 9FT ADLT (ELECTROSURGICAL) ×3
ELECTRODE REM PT RTRN 9FT ADLT (ELECTROSURGICAL) ×1 IMPLANT
EVACUATOR 1/8 PVC DRAIN (DRAIN) ×3 IMPLANT
FACESHIELD WRAPAROUND (MASK) ×6 IMPLANT
GAUZE SPONGE 4X4 12PLY STRL (GAUZE/BANDAGES/DRESSINGS) ×3 IMPLANT
GLOVE BIOGEL PI IND STRL 7.0 (GLOVE) ×1 IMPLANT
GLOVE BIOGEL PI INDICATOR 7.0 (GLOVE) ×2
GLOVE ORTHO TXT STRL SZ7.5 (GLOVE) ×3 IMPLANT
GLOVE SURG ORTHO 7.0 STRL STRW (GLOVE) ×3 IMPLANT
GOWN STRL REUS W/ TWL LRG LVL3 (GOWN DISPOSABLE) ×2 IMPLANT
GOWN STRL REUS W/ TWL XL LVL3 (GOWN DISPOSABLE) ×1 IMPLANT
GOWN STRL REUS W/TWL LRG LVL3 (GOWN DISPOSABLE) ×4
GOWN STRL REUS W/TWL XL LVL3 (GOWN DISPOSABLE) ×2
HANDPIECE INTERPULSE COAX TIP (DISPOSABLE) ×2
IMMOBILIZER KNEE 22 UNIV (SOFTGOODS) ×3 IMPLANT
IMMOBILIZER KNEE 24 THIGH 36 (MISCELLANEOUS) IMPLANT
IMMOBILIZER KNEE 24 UNIV (MISCELLANEOUS)
KIT BASIN OR (CUSTOM PROCEDURE TRAY) ×3 IMPLANT
KIT ROOM TURNOVER OR (KITS) ×3 IMPLANT
MANIFOLD NEPTUNE II (INSTRUMENTS) ×3 IMPLANT
NEEDLE 18GX1X1/2 (RX/OR ONLY) (NEEDLE) ×3 IMPLANT
NEEDLE HYPO 25GX1X1/2 BEV (NEEDLE) ×3 IMPLANT
NS IRRIG 1000ML POUR BTL (IV SOLUTION) ×3 IMPLANT
PACK TOTAL JOINT (CUSTOM PROCEDURE TRAY) ×3 IMPLANT
PACK UNIVERSAL I (CUSTOM PROCEDURE TRAY) ×3 IMPLANT
PAD ARMBOARD 7.5X6 YLW CONV (MISCELLANEOUS) ×6 IMPLANT
PAD CAST 4YDX4 CTTN HI CHSV (CAST SUPPLIES) ×1 IMPLANT
PADDING CAST COTTON 4X4 STRL (CAST SUPPLIES) ×2
PADDING CAST COTTON 6X4 STRL (CAST SUPPLIES) ×3 IMPLANT
SET HNDPC FAN SPRY TIP SCT (DISPOSABLE) ×1 IMPLANT
SPONGE GAUZE 4X4 12PLY STER LF (GAUZE/BANDAGES/DRESSINGS) ×3 IMPLANT
STRIP CLOSURE SKIN 1/2X4 (GAUZE/BANDAGES/DRESSINGS) ×2 IMPLANT
SUCTION FRAZIER TIP 10 FR DISP (SUCTIONS) ×3 IMPLANT
SUT MNCRL AB 4-0 PS2 18 (SUTURE) ×3 IMPLANT
SUT VIC AB 0 CT1 27 (SUTURE)
SUT VIC AB 0 CT1 27XBRD ANBCTR (SUTURE) IMPLANT
SUT VIC AB 1 CTX 36 (SUTURE) ×2
SUT VIC AB 1 CTX36XBRD ANBCTR (SUTURE) ×1 IMPLANT
SUT VIC AB 2-0 CT1 27 (SUTURE) ×6
SUT VIC AB 2-0 CT1 TAPERPNT 27 (SUTURE) ×3 IMPLANT
SYR 50ML LL SCALE MARK (SYRINGE) ×3 IMPLANT
SYR CONTROL 10ML LL (SYRINGE) ×3 IMPLANT
TOWEL OR 17X24 6PK STRL BLUE (TOWEL DISPOSABLE) ×3 IMPLANT
TOWEL OR 17X26 10 PK STRL BLUE (TOWEL DISPOSABLE) ×3 IMPLANT
WATER STERILE IRR 1000ML POUR (IV SOLUTION) ×3 IMPLANT

## 2015-03-24 NOTE — Transfer of Care (Signed)
Immediate Anesthesia Transfer of Care Note  Patient: Katherine Gilmore  Procedure(s) Performed: Procedure(s): TOTAL KNEE ARTHROPLASTY (Right)  Patient Location: PACU  Anesthesia Type:General  Level of Consciousness: awake, alert , oriented and patient cooperative  Airway & Oxygen Therapy: Patient Spontanous Breathing and Patient connected to face mask oxygen  Post-op Assessment: Report given to RN, Post -op Vital signs reviewed and stable and Patient moving all extremities X 4  Post vital signs: Reviewed and stable  Last Vitals:  Filed Vitals:   03/24/15 1246  BP:   Pulse:   Temp: 36.4 C  Resp: 6    Complications: No apparent anesthesia complications

## 2015-03-24 NOTE — Anesthesia Procedure Notes (Signed)
Procedure Name: Intubation Date/Time: 03/24/2015 11:10 AM Performed by: Salomon Mast Pre-anesthesia Checklist: Patient identified, Emergency Drugs available, Suction available and Patient being monitored Patient Re-evaluated:Patient Re-evaluated prior to inductionOxygen Delivery Method: Circle system utilized Preoxygenation: Pre-oxygenation with 100% oxygen Intubation Type: IV induction Ventilation: Mask ventilation without difficulty Laryngoscope Size: Mac and 3 Grade View: Grade I Tube type: Oral Tube size: 7.0 mm Number of attempts: 1 Placement Confirmation: ETT inserted through vocal cords under direct vision,  positive ETCO2 and breath sounds checked- equal and bilateral Secured at: 23 cm Tube secured with: Tape Dental Injury: Teeth and Oropharynx as per pre-operative assessment

## 2015-03-24 NOTE — H&P (View-Only) (Signed)
TOTAL KNEE ADMISSION H&P  Patient is being admitted for right total knee arthroplasty.  Subjective:  Chief Complaint:right knee pain.  HPI: Katherine Gilmore, 44 y.o. female, has a history of pain and functional disability in the right knee due to arthritis and has failed non-surgical conservative treatments for greater than 12 weeks to includeNSAID's and/or analgesics, corticosteriod injections and viscosupplementation injections.  Onset of symptoms was gradual, starting 2 years ago with rapidlly worsening course since that time. The patient noted no past surgery on the right knee(s).  Patient currently rates pain in the right knee(s) at 5 out of 10 with activity. Patient has night pain, worsening of pain with activity and weight bearing and crepitus.  Patient has evidence of subchondral sclerosis and joint space narrowing by imaging studies. There is no active infection.  Patient Active Problem List   Diagnosis Date Noted  . DJD (degenerative joint disease) of knee 01/06/2015   Past Medical History  Diagnosis Date  . Hypertension     not on BP meds currently    Past Surgical History  Procedure Laterality Date  . Breast reduction surgery    . Uterine fibroid surgery    . Cesarean section    . Hernia repair      as a baby  . Abdominal hysterectomy    . Total knee arthroplasty Left 01/06/2015    Procedure: TOTAL KNEE ARTHROPLASTY;  Surgeon: Mckinley Jewel, MD;  Location: [facility] OR;  Service: Orthopedics;  Laterality: Left;     (Not in a hospital admission) No Known Allergies  Social History  Substance Use Topics  . Smoking status: Never Smoker   . Smokeless tobacco: Not on file  . Alcohol Use: 0.6 oz/week    1 Glasses of wine per week     Comment: 2-3 nights a week    No family history on file.   Review of Systems  Constitutional: Negative.   HENT: Negative.   Eyes: Negative.   Respiratory: Negative.   Cardiovascular: Negative.   Gastrointestinal: Negative.   Genitourinary:  Negative.   Musculoskeletal: Positive for joint pain.  Skin: Negative.   Neurological: Negative.   Endo/Heme/Allergies: Negative.   Psychiatric/Behavioral: Negative.     Objective:  Physical Exam  Constitutional: She is oriented to person, place, and time. She appears well-developed and well-nourished.  HENT:  Head: Normocephalic and atraumatic.  Eyes: EOM are normal. Pupils are equal, round, and reactive to light.  Neck: Normal range of motion. Neck supple.  Cardiovascular: Normal rate and regular rhythm.   Respiratory: Effort normal and breath sounds normal.  GI: Soft. Bowel sounds are normal.  Neurological: She is alert and oriented to person, place, and time.  Skin: Skin is warm and dry.  Psychiatric: She has a normal mood and affect. Her behavior is normal. Judgment and thought content normal.    Vital signs in last 24 hours: @  Labs:   Estimated body mass index is 36.71 kg/(m^2) as calculated from the following:   Height as of 01/06/15: 5' 6.6" (1.692 m).   Weight as of 12/25/14: 105.101 kg (231 lb 11.3 oz).   Imaging Review Plain radiographs demonstrate severe degenerative joint disease of the right knee(s). The overall alignment ismild valgus. The bone quality appears to be fair for age and reported activity level.  Assessment/Plan:  End stage arthritis, right knee   The patient history, physical examination, clinical judgment of the provider and imaging studies are consistent with end stage degenerative joint disease of  the right knee(s) and total knee arthroplasty is deemed medically necessary. The treatment options including medical management, injection therapy arthroscopy and arthroplasty were discussed at length. The risks and benefits of total knee arthroplasty were presented and reviewed. The risks due to aseptic loosening, infection, stiffness, patella tracking problems, thromboembolic complications and other imponderables were discussed. The patient  acknowledged the explanation, agreed to proceed with the plan and consent was signed. Patient is being admitted for inpatient treatment for surgery, pain control, PT, OT, prophylactic antibiotics, VTE prophylaxis, progressive ambulation and ADL's and discharge planning. The patient is planning to be discharged home with home health services

## 2015-03-24 NOTE — Interval H&P Note (Signed)
History and Physical Interval Note:  03/24/2015 8:32 AM  Katherine Gilmore  has presented today for surgery, with the diagnosis of DJD RIGHT KNEE  The various methods of treatment have been discussed with the patient and family. After consideration of risks, benefits and other options for treatment, the patient has consented to  Procedure(s): TOTAL KNEE ARTHROPLASTY (Right) as a surgical intervention .  The patient's history has been reviewed, patient examined, no change in status, stable for surgery.  I have reviewed the patient's chart and labs.  Questions were answered to the patient's satisfaction.     Loreta Ave

## 2015-03-24 NOTE — Anesthesia Preprocedure Evaluation (Signed)
Anesthesia Evaluation  Patient identified by MRN, date of birth, ID band Patient awake    Reviewed: Allergy & Precautions, NPO status , Patient's Chart, lab work & pertinent test results  Airway Mallampati: II  TM Distance: >3 FB Neck ROM: Full    Dental no notable dental hx.    Pulmonary neg pulmonary ROS,    Pulmonary exam normal breath sounds clear to auscultation       Cardiovascular hypertension, Normal cardiovascular exam Rhythm:Regular Rate:Normal     Neuro/Psych negative neurological ROS  negative psych ROS   GI/Hepatic negative GI ROS, Neg liver ROS,   Endo/Other  Morbid obesity  Renal/GU negative Renal ROS  negative genitourinary   Musculoskeletal negative musculoskeletal ROS (+)   Abdominal   Peds negative pediatric ROS (+)  Hematology negative hematology ROS (+)   Anesthesia Other Findings   Reproductive/Obstetrics negative OB ROS                             Anesthesia Physical Anesthesia Plan  ASA: III  Anesthesia Plan: General   Post-op Pain Management: GA combined w/ Regional for post-op pain   Induction: Intravenous  Airway Management Planned: Oral ETT  Additional Equipment:   Intra-op Plan:   Post-operative Plan: Extubation in OR  Informed Consent: I have reviewed the patients History and Physical, chart, labs and discussed the procedure including the risks, benefits and alternatives for the proposed anesthesia with the patient or authorized representative who has indicated his/her understanding and acceptance.   Dental advisory given  Plan Discussed with: CRNA and Surgeon  Anesthesia Plan Comments:         Anesthesia Quick Evaluation

## 2015-03-24 NOTE — Anesthesia Postprocedure Evaluation (Signed)
  Anesthesia Post-op Note  Patient: Katherine Gilmore  Procedure(s) Performed: Procedure(s) (LRB): TOTAL KNEE ARTHROPLASTY (Right)  Patient Location: PACU  Anesthesia Type: General  Level of Consciousness: awake and alert   Airway and Oxygen Therapy: Patient Spontanous Breathing  Post-op Pain: mild  Post-op Assessment: Post-op Vital signs reviewed, Patient's Cardiovascular Status Stable, Respiratory Function Stable, Patent Airway and No signs of Nausea or vomiting  Last Vitals:  Filed Vitals:   03/24/15 1345  BP: 154/113  Pulse: 79  Temp:   Resp: 16    Post-op Vital Signs: stable   Complications: No apparent anesthesia complications

## 2015-03-24 NOTE — Progress Notes (Signed)
Orthopedic Tech Progress Note Patient Details:  Katherine Gilmore May 24, 1971 [id number] Applied CPM to RLE.  Applied OHF with trapeze to pt.'s bed. CPM Right Knee CPM Right Knee: On Right Knee Flexion (Degrees): 90 Right Knee Extension (Degrees): 0   Lesle Chris 03/24/2015, 1:20 PM

## 2015-03-24 NOTE — Discharge Summary (Addendum)
Patient ID: Katherine Gilmore MRN: [id number] DOB/AGE: September 18, 1970 44 y.o.  Admit date: 03/24/2015 Discharge date: 03/26/2015  Admission Diagnoses:  Active Problems:   DJD (degenerative joint disease) of knee   Discharge Diagnoses:  Same  Past Medical History  Diagnosis Date  . Hypertension     not on BP meds currently    Surgeries: Procedure(s): TOTAL KNEE ARTHROPLASTY on 03/24/2015   Consultants:    Discharged Condition: Improved  Hospital Course: Katherine Gilmore is an 44 y.o. female who was admitted 03/24/2015 for operative treatment of primary localized osteoarthritis right knee. Patient has severe unremitting pain that affects sleep, daily activities, and work/hobbies. After pre-op clearance the patient was taken to the operating room on 03/24/2015 and underwent  Procedure(s): TOTAL KNEE ARTHROPLASTY.  While attempting to pull the hemovac drain on pod#1, part of the drain was retained within the knee.  Patient was taken back to the OR where the retained portion of drain was removed.   Patient with a pre-op Hb of 12.8 developed abla on pod#1 with a hb of 10.7 and 8.8 on pod#2.Marland Kitchen  She is currently stable but we will continue to follow.  Patient was given perioperative antibiotics:      Anti-infectives    Start     Dose/Rate Route Frequency Ordered Stop   03/25/15 1400  ceFAZolin (ANCEF) IVPB 2 g/50 mL premix     2 g 100 mL/hr over 30 Minutes Intravenous 3 times per day 03/25/15 1013     03/24/15 1100  ceFAZolin (ANCEF) IVPB 2 g/50 mL premix     2 g 100 mL/hr over 30 Minutes Intravenous To ShortStay Surgical 03/23/15 1221 03/24/15 1115       Patient was given sequential compression devices, early ambulation, and chemoprophylaxis to prevent DVT.  Patient benefited maximally from hospital stay and there were no complications.    Recent vital signs:  Patient Vitals for the past 24 hrs:  BP Temp Pulse Resp SpO2  03/26/15 0548 116/76 mmHg 98.6 F (37 C) 77 16 97 %  03/25/15  2025 (!) 152/98 mmHg 98.7 F (37.1 C) 90 15 94 %  03/25/15 1930 - 97.9 F (36.6 C) 74 10 98 %  03/25/15 1928 140/87 mmHg - 71 11 98 %  03/25/15 1915 (!) 154/90 mmHg - 79 13 100 %  03/25/15 1900 (!) 152/86 mmHg - 71 10 99 %  03/25/15 1845 (!) 144/77 mmHg - 68 10 100 %  03/25/15 1830 140/72 mmHg 97.2 F (36.2 C) 76 (!) 9 90 %  03/25/15 1515 139/72 mmHg 98.3 F (36.8 C) 79 20 96 %     Recent laboratory studies:   Recent Labs  03/25/15 0636 03/26/15 0439  WBC 12.9* 13.5*  HGB 10.7* 8.8*  HCT 33.5* 27.9*  PLT 264 251  NA 134* 137  K 3.4* 3.8  CL 99* 100*  CO2 26 27  BUN 11 13  CREATININE 1.10* 1.03*  GLUCOSE 116* 119*  CALCIUM 8.8* 8.7*     Discharge Medications:     Medication List    STOP taking these medications        naproxen sodium 220 MG tablet  Commonly known as:  ANAPROX      TAKE these medications        apixaban 2.5 MG Tabs tablet  Commonly known as:  ELIQUIS  Take 1 tab po q12 hours x 14 days following surgery to prevent blood clots     bisacodyl 5 MG EC  tablet  Commonly known as:  DULCOLAX  Take 1 tablet (5 mg total) by mouth daily as needed for moderate constipation.     methocarbamol 500 MG tablet  Commonly known as:  ROBAXIN  Take 1 tablet (500 mg total) by mouth every 6 (six) hours as needed for muscle spasms.     multivitamin with minerals Tabs tablet  Take 1 tablet by mouth at bedtime.     ondansetron 4 MG tablet  Commonly known as:  ZOFRAN  Take 1 tablet (4 mg total) by mouth every 8 (eight) hours as needed for nausea or vomiting.     oxyCODONE-acetaminophen 5-325 MG tablet  Commonly known as:  PERCOCET  Take 1-2 tablets by mouth every 4 (four) hours as needed for severe pain.        Diagnostic Studies: Dg Knee 1-2 Views Right  03/25/2015   CLINICAL DATA:  Removal of wound drain  EXAM: DG C-ARM 61-120 MIN; RIGHT KNEE - 1-2 VIEW  COMPARISON:  Radiograph from earlier the same day  FINDINGS: a single fluoroscopic spot image  documents interval removal of the foreign body projecting over the distal femoral shaft. Visualized components of knee arthroplasty appear grossly stable.  IMPRESSION: 1. Interval removal of soft tissue foreign body.   Electronically Signed   By: Corlis Leak M.D.   On: 03/25/2015 18:47   Dg Knee Complete 4 Views Right  03/25/2015   CLINICAL DATA:  Foreign body in right knee.  Surgery 1 day prior  EXAM: RIGHT KNEE - COMPLETE 4+ VIEW  COMPARISON:  March 24, 2015  FINDINGS: Frontal, lateral, and bilateral oblique views were obtained. A piece of drain is noted in the suprapatellar bursa region anteriorly. Femoral and tibial prosthetic components appear well seated. No fracture or dislocation. There is soft tissue edema with small joint effusion.  IMPRESSION: A piece of surgical drain is noted in the suprapatellar bursa. Status post total knee replacement with prosthetic components appearing well-seated. Generalized soft tissue swelling. No acute fracture or dislocation.  These results will be called to the ordering clinician or representative by the Radiologist Assistant, and communication documented in the PACS or zVision Dashboard.   Electronically Signed   By: Bretta Bang III M.D.   On: 03/25/2015 10:51   Dg Knee Right Port  03/24/2015   CLINICAL DATA:  Status post total right knee replacement  EXAM: PORTABLE RIGHT KNEE - 1-2 VIEW  COMPARISON:  None.  FINDINGS: Changes of right knee replacement. Soft tissue and joint space gas. Joint space of fusion. No hardware or bony complicating feature.  IMPRESSION: Right knee replacement without complicating   Electronically Signed   By: Charlett Nose M.D.   On: 03/24/2015 13:14   Dg C-arm 1-60 Min  03/25/2015   CLINICAL DATA:  Removal of wound drain  EXAM: DG C-ARM 61-120 MIN; RIGHT KNEE - 1-2 VIEW  COMPARISON:  Radiograph from earlier the same day  FINDINGS: a single fluoroscopic spot image documents interval removal of the foreign body projecting over the  distal femoral shaft. Visualized components of knee arthroplasty appear grossly stable.  IMPRESSION: 1. Interval removal of soft tissue foreign body.   Electronically Signed   By: Corlis Leak M.D.   On: 03/25/2015 18:47    Disposition: 06-Home-Health Care Svc    Follow-up Information    Follow up with Loreta Ave, MD. Schedule an appointment as soon as possible for a visit in 2 weeks.   Specialty:  Orthopedic Surgery  Contact information:   [address] [place] [address] [phone]       Follow up with [facility].   Why:  They will contact you to schedule home therapy visits.    Contact information:   [address] [place] [address] [phone]        Signed: Otilio Saber 03/26/2015, 7:48 AM

## 2015-03-25 ENCOUNTER — Encounter (HOSPITAL_COMMUNITY)
Admission: RE | Disposition: A | Discharge status: Home Health | Payer: Self-pay | Source: Ambulatory Visit | Attending: Orthopedic Surgery

## 2015-03-25 ENCOUNTER — Inpatient Hospital Stay (HOSPITAL_COMMUNITY): Payer: BLUE CROSS/BLUE SHIELD | Admitting: Anesthesiology

## 2015-03-25 ENCOUNTER — Inpatient Hospital Stay (HOSPITAL_COMMUNITY): Payer: BLUE CROSS/BLUE SHIELD

## 2015-03-25 ENCOUNTER — Encounter (HOSPITAL_COMMUNITY): Payer: Self-pay | Admitting: Orthopedic Surgery

## 2015-03-25 HISTORY — PX: I&D EXTREMITY: SHX5045

## 2015-03-25 LAB — CBC
HCT: 33.5 % — ABNORMAL LOW (ref 36.0–46.0)
Hemoglobin: 10.7 g/dL — ABNORMAL LOW (ref 12.0–15.0)
MCH: 26.2 pg (ref 26.0–34.0)
MCHC: 31.9 g/dL (ref 30.0–36.0)
MCV: 82.1 fL (ref 78.0–100.0)
PLATELETS: 264 10*3/uL (ref 150–400)
RBC: 4.08 MIL/uL (ref 3.87–5.11)
RDW: 13.7 % (ref 11.5–15.5)
WBC: 12.9 10*3/uL — ABNORMAL HIGH (ref 4.0–10.5)

## 2015-03-25 LAB — BASIC METABOLIC PANEL
Anion gap: 9 (ref 5–15)
BUN: 11 mg/dL (ref 6–20)
CALCIUM: 8.8 mg/dL — AB (ref 8.9–10.3)
CO2: 26 mmol/L (ref 22–32)
CREATININE: 1.1 mg/dL — AB (ref 0.44–1.00)
Chloride: 99 mmol/L — ABNORMAL LOW (ref 101–111)
Glucose, Bld: 116 mg/dL — ABNORMAL HIGH (ref 65–99)
Potassium: 3.4 mmol/L — ABNORMAL LOW (ref 3.5–5.1)
SODIUM: 134 mmol/L — AB (ref 135–145)

## 2015-03-25 SURGERY — IRRIGATION AND DEBRIDEMENT EXTREMITY
Anesthesia: General | Site: Knee | Laterality: Right

## 2015-03-25 MED ORDER — FENTANYL CITRATE (PF) 250 MCG/5ML IJ SOLN
INTRAMUSCULAR | Status: AC
  Filled 2015-03-25: qty 5

## 2015-03-25 MED ORDER — PROMETHAZINE HCL 25 MG/ML IJ SOLN: 6.2500 mg | INTRAMUSCULAR | Status: DC | PRN

## 2015-03-25 MED ORDER — ONDANSETRON HCL 4 MG/2ML IJ SOLN
INTRAMUSCULAR | Status: DC | PRN
  Administered 2015-03-25: 4 mg via INTRAVENOUS

## 2015-03-25 MED ORDER — MIDAZOLAM HCL 5 MG/5ML IJ SOLN
INTRAMUSCULAR | Status: DC | PRN
  Administered 2015-03-25: 2 mg via INTRAVENOUS

## 2015-03-25 MED ORDER — MIDAZOLAM HCL 2 MG/2ML IJ SOLN
INTRAMUSCULAR | Status: AC
  Filled 2015-03-25: qty 4

## 2015-03-25 MED ORDER — HYDROMORPHONE HCL 1 MG/ML IJ SOLN
INTRAMUSCULAR | Status: AC
  Filled 2015-03-25: qty 1

## 2015-03-25 MED ORDER — ONDANSETRON HCL 4 MG/2ML IJ SOLN
INTRAMUSCULAR | Status: AC
  Filled 2015-03-25: qty 2

## 2015-03-25 MED ORDER — LACTATED RINGERS IV SOLN
INTRAVENOUS | Status: DC
  Administered 2015-03-25 (×2): via INTRAVENOUS

## 2015-03-25 MED ORDER — SODIUM CHLORIDE 0.9 % IV SOLN
INTRAVENOUS | Status: DC
  Administered 2015-03-25 – 2015-03-26 (×2): via INTRAVENOUS

## 2015-03-25 MED ORDER — LIDOCAINE HCL (CARDIAC) 20 MG/ML IV SOLN
INTRAVENOUS | Status: AC
  Filled 2015-03-25: qty 5

## 2015-03-25 MED ORDER — DEXAMETHASONE SODIUM PHOSPHATE 4 MG/ML IJ SOLN
INTRAMUSCULAR | Status: DC | PRN
  Administered 2015-03-25: 4 mg via INTRAVENOUS

## 2015-03-25 MED ORDER — PROPOFOL 10 MG/ML IV BOLUS
INTRAVENOUS | Status: DC | PRN
  Administered 2015-03-25: 200 mg via INTRAVENOUS

## 2015-03-25 MED ORDER — CEFAZOLIN SODIUM-DEXTROSE 2-3 GM-% IV SOLR
2.0000 g | Freq: Three times a day (TID) | INTRAVENOUS | Status: DC
  Administered 2015-03-25 – 2015-03-26 (×3): 2 g via INTRAVENOUS
  Filled 2015-03-25 (×5): qty 50

## 2015-03-25 MED ORDER — 0.9 % SODIUM CHLORIDE (POUR BTL) OPTIME
TOPICAL | Status: DC | PRN
  Administered 2015-03-25: 1000 mL

## 2015-03-25 MED ORDER — SODIUM CHLORIDE 0.9 % IR SOLN
Status: DC | PRN
  Administered 2015-03-25: 3000 mL

## 2015-03-25 MED ORDER — FENTANYL CITRATE (PF) 250 MCG/5ML IJ SOLN
INTRAMUSCULAR | Status: DC | PRN
  Administered 2015-03-25 (×2): 50 ug via INTRAVENOUS
  Administered 2015-03-25: 100 ug via INTRAVENOUS
  Administered 2015-03-25: 50 ug via INTRAVENOUS

## 2015-03-25 MED ORDER — HYDROMORPHONE HCL 1 MG/ML IJ SOLN
0.2500 mg | INTRAMUSCULAR | Status: DC | PRN
  Administered 2015-03-25: 0.5 mg via INTRAVENOUS

## 2015-03-25 MED ORDER — DEXTROSE 50 % IV SOLN
INTRAVENOUS | Status: AC
  Filled 2015-03-25: qty 50

## 2015-03-25 SURGICAL SUPPLY — 55 items
BANDAGE ELASTIC 4 VELCRO ST LF (GAUZE/BANDAGES/DRESSINGS) ×3 IMPLANT
BANDAGE ELASTIC 6 VELCRO ST LF (GAUZE/BANDAGES/DRESSINGS) ×3 IMPLANT
BLADE SURG 10 STRL SS (BLADE) ×3 IMPLANT
BNDG COHESIVE 4X5 TAN STRL (GAUZE/BANDAGES/DRESSINGS) ×3 IMPLANT
BNDG GAUZE ELAST 4 BULKY (GAUZE/BANDAGES/DRESSINGS) ×3 IMPLANT
CLOSURE STERI-STRIP 1/2X4 (GAUZE/BANDAGES/DRESSINGS) ×1
CLSR STERI-STRIP ANTIMIC 1/2X4 (GAUZE/BANDAGES/DRESSINGS) ×2 IMPLANT
COVER MAYO STAND STRL (DRAPES) ×3 IMPLANT
COVER SURGICAL LIGHT HANDLE (MISCELLANEOUS) ×3 IMPLANT
CUFF TOURNIQUET SINGLE 34IN LL (TOURNIQUET CUFF) ×3 IMPLANT
DRAPE C-ARM 42X72 X-RAY (DRAPES) ×3 IMPLANT
DRSG MEPILEX BORDER 4X12 (GAUZE/BANDAGES/DRESSINGS) ×3 IMPLANT
DURAPREP 26ML APPLICATOR (WOUND CARE) ×3 IMPLANT
ELECT REM PT RETURN 9FT ADLT (ELECTROSURGICAL)
ELECTRODE REM PT RTRN 9FT ADLT (ELECTROSURGICAL) IMPLANT
EVACUATOR 1/8 PVC DRAIN (DRAIN) IMPLANT
FACESHIELD WRAPAROUND (MASK) IMPLANT
GAUZE SPONGE 4X4 12PLY STRL (GAUZE/BANDAGES/DRESSINGS) ×3 IMPLANT
GAUZE XEROFORM 1X8 LF (GAUZE/BANDAGES/DRESSINGS) ×3 IMPLANT
GLOVE BIO SURGEON STRL SZ7 (GLOVE) ×3 IMPLANT
GLOVE BIO SURGEON STRL SZ7.5 (GLOVE) ×3 IMPLANT
GLOVE BIOGEL PI IND STRL 7.0 (GLOVE) ×1 IMPLANT
GLOVE BIOGEL PI INDICATOR 7.0 (GLOVE) ×2
GOWN STRL REUS W/ TWL LRG LVL3 (GOWN DISPOSABLE) ×2 IMPLANT
GOWN STRL REUS W/TWL LRG LVL3 (GOWN DISPOSABLE) ×4
HANDPIECE INTERPULSE COAX TIP (DISPOSABLE) ×2
KIT BASIN OR (CUSTOM PROCEDURE TRAY) ×3 IMPLANT
KIT ROOM TURNOVER OR (KITS) ×3 IMPLANT
MANIFOLD NEPTUNE II (INSTRUMENTS) ×3 IMPLANT
NS IRRIG 1000ML POUR BTL (IV SOLUTION) ×3 IMPLANT
PACK ORTHO EXTREMITY (CUSTOM PROCEDURE TRAY) ×3 IMPLANT
PAD ARMBOARD 7.5X6 YLW CONV (MISCELLANEOUS) ×6 IMPLANT
PENCIL BUTTON HOLSTER BLD 10FT (ELECTRODE) IMPLANT
SET HNDPC FAN SPRY TIP SCT (DISPOSABLE) ×1 IMPLANT
SPONGE LAP 18X18 X RAY DECT (DISPOSABLE) ×3 IMPLANT
STOCKINETTE IMPERVIOUS 9X36 MD (GAUZE/BANDAGES/DRESSINGS) IMPLANT
STOCKINETTE IMPERVIOUS LG (DRAPES) ×3 IMPLANT
SUT ETHILON 3 0 PS 1 (SUTURE) IMPLANT
SUT MNCRL AB 4-0 PS2 18 (SUTURE) ×3 IMPLANT
SUT MON AB 2-0 CT1 36 (SUTURE) ×3 IMPLANT
SUT VIC AB 0 CT1 27 (SUTURE) ×2
SUT VIC AB 0 CT1 27XBRD ANBCTR (SUTURE) ×1 IMPLANT
SUT VIC AB 0 CTX 36 (SUTURE) ×2
SUT VIC AB 0 CTX36XBRD ANTBCTR (SUTURE) ×1 IMPLANT
SUT VIC AB 2-0 CT1 27 (SUTURE) ×2
SUT VIC AB 2-0 CT1 TAPERPNT 27 (SUTURE) ×1 IMPLANT
TOWEL OR 17X24 6PK STRL BLUE (TOWEL DISPOSABLE) ×3 IMPLANT
TOWEL OR 17X26 10 PK STRL BLUE (TOWEL DISPOSABLE) ×3 IMPLANT
TOWEL OR NON WOVEN STRL DISP B (DISPOSABLE) ×3 IMPLANT
TUBE ANAEROBIC SPECIMEN COL (MISCELLANEOUS) IMPLANT
TUBE CONNECTING 12'X1/4 (SUCTIONS) ×1
TUBE CONNECTING 12X1/4 (SUCTIONS) ×2 IMPLANT
UNDERPAD 30X30 INCONTINENT (UNDERPADS AND DIAPERS) ×3 IMPLANT
WATER STERILE IRR 1000ML POUR (IV SOLUTION) IMPLANT
YANKAUER SUCT BULB TIP NO VENT (SUCTIONS) ×3 IMPLANT

## 2015-03-25 NOTE — Evaluation (Signed)
Physical Therapy Evaluation Patient Details Name: Katherine Gilmore MRN: [id number] DOB: Jul 05, 1970 Today's Date: 03/25/2015   History of Present Illness  44 y/o female s/p R TKA (03/24/15). Pt had L TKA in July with good results.  Clinical Impression  Pt admitted with above diagnosis. Pt currently with functional limitations due to the deficits listed below (see PT Problem List). Pt will benefit from skilled PT to increase their independence and safety with mobility to allow discharge to the venue listed below.  Pt did well with PT and anticipate continued progress to allow d/c home with mother A and HHPT.     Follow Up Recommendations Home health PT    Equipment Recommendations  None recommended by PT    Recommendations for Other Services       Precautions / Restrictions Restrictions Weight Bearing Restrictions: Yes RLE Weight Bearing: Weight bearing as tolerated      Mobility  Bed Mobility Overal bed mobility: Needs Assistance Bed Mobility: Sit to Supine       Sit to supine: Supervision   General bed mobility comments: Pt able to get R Leg up with some difficulty but no A.  Transfers Overall transfer level: Needs assistance Equipment used: Rolling walker (2 wheeled) Transfers: Sit to/from Stand Sit to Stand: Min guard;Supervision         General transfer comment: Stood from recliner, toilet, and mat in gym  Ambulation/Gait Ambulation/Gait assistance: Supervision;Min guard Ambulation Distance (Feet): 140 Feet (x2) Assistive device: Rolling walker (2 wheeled) Gait Pattern/deviations: Decreased stance time - right;Antalgic     General Gait Details: Pt reports increased pain to 6/10 with gait, but did well.  Stairs Stairs: Yes Stairs assistance: Min guard Stair Management: Step to pattern;Sideways;One rail Right Number of Stairs: 2 (x2) General stair comments: Pt stated she didn't have difficulty getting into condo after last TKA, but would like to review  how to go up.  Couldn't recall proper sequence.  Pt educated and demonstrated with pt able to return demonstrate.  Wheelchair Mobility    Modified Rankin (Stroke Patients Only)       Balance Overall balance assessment: No apparent balance deficits (not formally assessed)                                           Pertinent Vitals/Pain Pain Assessment: 0-10 Pain Score: 4  Pain Location: R knee Pain Descriptors / Indicators: Aching    Home Living Family/patient expects to be discharged to:: Private residence Living Arrangements: Children Available Help at Discharge: Family;Available 24 hours/day Type of Home: Apartment Home Access: Stairs to enter Entrance Stairs-Rails: Right;Left Entrance Stairs-Number of Steps: 12 Home Layout: One level Home Equipment: Walker - 2 wheels;Bedside commode;Shower seat - built in      Prior Function Level of Independence: Independent               Hand Dominance   Dominant Hand: Right    Extremity/Trunk Assessment   Upper Extremity Assessment: Overall WFL for tasks assessed           Lower Extremity Assessment: RLE deficits/detail RLE Deficits / Details: -15 to 66 degrees AROM, fair quad set with bulky dressing       Communication   Communication: No difficulties  Cognition Arousal/Alertness: Awake/alert Behavior During Therapy: WFL for tasks assessed/performed Overall Cognitive Status: Within Functional Limits for tasks assessed  General Comments      Exercises Total Joint Exercises Ankle Circles/Pumps: AROM;Both;10 reps Quad Sets: AROM;Both;10 reps Heel Slides: AROM;Both;10 reps Goniometric ROM: -15-66 degrees      Assessment/Plan    PT Assessment Patient needs continued PT services  PT Diagnosis Difficulty walking   PT Problem List Decreased strength;Decreased range of motion;Decreased activity tolerance;Decreased balance;Decreased mobility;Decreased  knowledge of use of DME  PT Treatment Interventions DME instruction;Gait training;Stair training;Functional mobility training;Therapeutic activities;Therapeutic exercise   PT Goals (Current goals can be found in the Care Plan section) Acute Rehab PT Goals Patient Stated Goal: go home PT Goal Formulation: With patient/family Time For Goal Achievement: 04/01/15 Potential to Achieve Goals: Good    Frequency 7X/week   Barriers to discharge        Co-evaluation               End of Session Equipment Utilized During Treatment: Gait belt Activity Tolerance: Patient tolerated treatment well Patient left: in bed;with call bell/phone within reach;with family/visitor present;with nursing/sitter in room Nurse Communication: Mobility status         Time: 1610-9604 PT Time Calculation (min) (ACUTE ONLY): 29 min   Charges:   PT Evaluation $Initial PT Evaluation Tier I: 1 Procedure PT Treatments $Gait Training: 8-22 mins   PT G Codes:        Kashus Karlen LUBECK 03/25/2015, 10:32 AM

## 2015-03-25 NOTE — Interval H&P Note (Signed)
History and Physical Interval Note:  03/25/2015 11:00 AM  Katherine Gilmore  has presented today for surgery, with the diagnosis of retained drain right knee  The various methods of treatment have been discussed with the patient and family. After consideration of risks, benefits and other options for treatment, the patient has consented to  Procedure(s): FOREIGN BODY REMOVAL RIGHT KNEE (Right) as a surgical intervention .  The patient's history has been reviewed, patient examined, no change in status, stable for surgery.  I have reviewed the patient's chart and labs.  Questions were answered to the patient's satisfaction.     Bright Spielmann D

## 2015-03-25 NOTE — Progress Notes (Signed)
PT Cancellation Note  Patient Details Name: Katherine Gilmore MRN: [id number] DOB: November 29, 1970   Cancelled Treatment:    Reason Eval/Treat Not Completed: Medical issues which prohibited therapy. Pt about to go to surgery for removal of portion of hemovac drain that remain lodged in tissue. Nurse requested to hold off til tomorrow AM.    Kingsley Callander LUBECK 03/25/2015, 1:31 PM

## 2015-03-25 NOTE — Anesthesia Postprocedure Evaluation (Signed)
  Anesthesia Post-op Note  Patient: Katherine Gilmore  Procedure(s) Performed: Procedure(s) with comments: FOREIGN BODY REMOVAL RIGHT KNEE (Right) - removal section hemovac drain tubing  Patient Location: PACU  Anesthesia Type:General  Level of Consciousness: awake  Airway and Oxygen Therapy: Patient Spontanous Breathing  Post-op Pain: mild  Post-op Assessment: Post-op Vital signs reviewed              Post-op Vital Signs: Reviewed  Last Vitals:  Filed Vitals:   03/25/15 1930  BP:   Pulse: 74  Temp: 36.6 C  Resp: 10    Complications: No apparent anesthesia complications

## 2015-03-25 NOTE — Discharge Instructions (Signed)
INSTRUCTIONS AFTER JOINT REPLACEMENT   o Remove items at home which could result in a fall. This includes throw rugs or furniture in walking pathways o ICE to the affected joint every three hours while awake for 30 minutes at a time, for at least the first 3-5 days, and then as needed for pain and swelling.  Continue to use ice for pain and swelling. You may notice swelling that will progress down to the foot and ankle.  This is normal after surgery.  Elevate your leg when you are not up walking on it.   o Continue to use the breathing machine you got in the hospital (incentive spirometer) which will help keep your temperature down.  It is common for your temperature to cycle up and down following surgery, especially at night when you are not up moving around and exerting yourself.  The breathing machine keeps your lungs expanded and your temperature down.  BLOOD THINNER: TAKE ELIQUIS AS DIRECTED FOR A TOTAL OF 14 DAYS FOLLOWING SURGERY.  ONCE FINISHED WITH THIS, TAKE ASPIRIN 325 MG ONE TAB ONCE DAILY FOR THE NEXT 14 DAYS.  THESE ARE BOTH USED TO PREVENT BLOOD CLOTS.Marland Kitchen  DIET:  As you were doing prior to hospitalization, we recommend a well-balanced diet.  DRESSING / WOUND CARE / SHOWERING  You may change your dressing 3-5 days after surgery.  Then change the dressing every day with sterile gauze.  Please use good hand washing techniques before changing the dressing.  Do not use any lotions or creams on the incision until instructed by your surgeon. and You may shower 3 days after surgery, but keep the wounds dry during showering.  You may use an occlusive plastic wrap (Press'n Seal for example), NO SOAKING/SUBMERGING IN THE BATHTUB.  If the bandage gets wet, change with a clean dry gauze.  If the incision gets wet, pat the wound dry with a clean towel.  ACTIVITY  o Increase activity slowly as tolerated, but follow the weight bearing instructions below.   o No driving for 6 weeks or until further  direction given by your physician.  You cannot drive while taking narcotics.  o No lifting or carrying greater than 10 lbs. until further directed by your surgeon. o Avoid periods of inactivity such as sitting longer than an hour when not asleep. This helps prevent blood clots.  o You may return to work once you are authorized by your doctor.     WEIGHT BEARING   Weight bearing as tolerated with assist device (walker, cane, etc) as directed, use it as long as suggested by your surgeon or therapist, typically at least 4-6 weeks.   EXERCISES  Results after joint replacement surgery are often greatly improved when you follow the exercise, range of motion and muscle strengthening exercises prescribed by your doctor. Safety measures are also important to protect the joint from further injury. Any time any of these exercises cause you to have increased pain or swelling, decrease what you are doing until you are comfortable again and then slowly increase them. If you have problems or questions, call your caregiver or physical therapist for advice.   Rehabilitation is important following a joint replacement. After just a few days of immobilization, the muscles of the leg can become weakened and shrink (atrophy).  These exercises are designed to build up the tone and strength of the thigh and leg muscles and to improve motion. Often times heat used for twenty to thirty minutes before working out  will loosen up your tissues and help with improving the range of motion but do not use heat for the first two weeks following surgery (sometimes heat can increase post-operative swelling).   These exercises can be done on a training (exercise) mat, on the floor, on a table or on a bed. Use whatever works the best and is most comfortable for you.    Use music or television while you are exercising so that the exercises are a pleasant break in your day. This will make your life better with the exercises acting as a  break in your routine that you can look forward to.   Perform all exercises about fifteen times, three times per day or as directed.  You should exercise both the operative leg and the other leg as well.  Exercises include:    Quad Sets - Tighten up the muscle on the front of the thigh (Quad) and hold for 5-10 seconds.    Straight Leg Raises - With your knee straight (if you were given a brace, keep it on), lift the leg to 60 degrees, hold for 3 seconds, and slowly lower the leg.  Perform this exercise against resistance later as your leg gets stronger.   Leg Slides: Lying on your back, slowly slide your foot toward your buttocks, bending your knee up off the floor (only go as far as is comfortable). Then slowly slide your foot back down until your leg is flat on the floor again.   Angel Wings: Lying on your back spread your legs to the side as far apart as you can without causing discomfort.   Hamstring Strength:  Lying on your back, push your heel against the floor with your leg straight by tightening up the muscles of your buttocks.  Repeat, but this time bend your knee to a comfortable angle, and push your heel against the floor.  You may put a pillow under the heel to make it more comfortable if necessary.   A rehabilitation program following joint replacement surgery can speed recovery and prevent re-injury in the future due to weakened muscles. Contact your doctor or a physical therapist for more information on knee rehabilitation.    CONSTIPATION  Constipation is defined medically as fewer than three stools per week and severe constipation as less than one stool per week.  Even if you have a regular bowel pattern at home, your normal regimen is likely to be disrupted due to multiple reasons following surgery.  Combination of anesthesia, postoperative narcotics, change in appetite and fluid intake all can affect your bowels.   YOU MUST use at least one of the following options; they are  listed in order of increasing strength to get the job done.  They are all available over the counter, and you may need to use some, POSSIBLY even all of these options:    Drink plenty of fluids (prune juice may be helpful) and high fiber foods Colace 100 mg by mouth twice a day  Senokot for constipation as directed and as needed Dulcolax (bisacodyl), take with full glass of water  Miralax (polyethylene glycol) once or twice a day as needed.  If you have tried all these things and are unable to have a bowel movement in the first 3-4 days after surgery call either your surgeon or your primary doctor.    If you experience loose stools or diarrhea, hold the medications until you stool forms back up.  If your symptoms do not get better  within 1 week or if they get worse, check with your doctor.  If you experience "the worst abdominal pain ever" or develop nausea or vomiting, please contact the office immediately for further recommendations for treatment.   ITCHING:  If you experience itching with your medications, try taking only a single pain pill, or even half a pain pill at a time.  You can also use Benadryl over the counter for itching or also to help with sleep.   TED HOSE STOCKINGS:  Use stockings on both legs until for at least 2 weeks or as directed by physician office. They may be removed at night for sleeping.  MEDICATIONS:  See your medication summary on the After Visit Summary that nursing will review with you.  You may have some home medications which will be placed on hold until you complete the course of blood thinner medication.  It is important for you to complete the blood thinner medication as prescribed.  PRECAUTIONS:  If you experience chest pain or shortness of breath - call 911 immediately for transfer to the hospital emergency department.   If you develop a fever greater that 101 F, purulent drainage from wound, increased redness or drainage from wound, foul odor from the  wound/dressing, or calf pain - CONTACT YOUR SURGEON.                                                   FOLLOW-UP APPOINTMENTS:  If you do not already have a post-op appointment, please call the office for an appointment to be seen by your surgeon.  Guidelines for how soon to be seen are listed in your After Visit Summary, but are typically between 1-4 weeks after surgery.  OTHER INSTRUCTIONS:   Knee Replacement:  Do not place pillow under knee, focus on keeping the knee straight while resting. CPM instructions: 0-90 degrees, 2 hours in the morning, 2 hours in the afternoon, and 2 hours in the evening. Place foam block, curve side up under heel at all times except when in CPM or when walking.  DO NOT modify, tear, cut, or change the foam block in any way.  MAKE SURE YOU:   Understand these instructions.   Get help right away if you are not doing well or get worse.    Thank you for letting us be a part of your medical care team.  It is a privilege we respect greatly.  We hope these instructions will help you stay on track for a fast and full recovery!   Information on my medicine - ELIQUIS (apixaban)  This medication education was reviewed with me or my healthcare representative as part of my discharge preparation.  The pharmacist that spoke with me during my hospital stay was:  Rolley Sims, [facility]  Why was Eliquis prescribed for you? Eliquis was prescribed for you to reduce the risk of blood clots forming after orthopedic surgery.    What do You need to know about Eliquis? Take your Eliquis TWICE DAILY - one tablet in the morning and one tablet in the evening with or without food.  It would be best to take the dose about the same time each day.  If you have difficulty swallowing the tablet whole please discuss with your pharmacist how to take the medication safely.  Take Eliquis exactly as prescribed by  your doctor and DO NOT stop taking Eliquis without talking to the  doctor who prescribed the medication.  Stopping without other medication to take the place of Eliquis may increase your risk of developing a clot.  After discharge, you should have regular check-up appointments with your healthcare provider that is prescribing your Eliquis.  What do you do if you miss a dose? If a dose of ELIQUIS is not taken at the scheduled time, take it as soon as possible on the same day and twice-daily administration should be resumed.  The dose should not be doubled to make up for a missed dose.  Do not take more than one tablet of ELIQUIS at the same time.  Important Safety Information A possible side effect of Eliquis is bleeding. You should call your healthcare provider right away if you experience any of the following: ? Bleeding from an injury or your nose that does not stop. ? Unusual colored urine (red or dark brown) or unusual colored stools (red or black). ? Unusual bruising for unknown reasons. ? A serious fall or if you hit your head (even if there is no bleeding).  Some medicines may interact with Eliquis and might increase your risk of bleeding or clotting while on Eliquis. To help avoid this, consult your healthcare provider or pharmacist prior to using any new prescription or non-prescription medications, including herbals, vitamins, non-steroidal anti-inflammatory drugs (NSAIDs) and supplements.  This website has more information on Eliquis (apixaban): http://www.eliquis.com/eliquis/home

## 2015-03-25 NOTE — Transfer of Care (Signed)
Immediate Anesthesia Transfer of Care Note  Patient: Katherine Gilmore  Procedure(s) Performed: Procedure(s) with comments: FOREIGN BODY REMOVAL RIGHT KNEE (Right) - removal section hemovac drain tubing  Patient Location: PACU  Anesthesia Type:General  Level of Consciousness: lethargic and responds to stimulation  Airway & Oxygen Therapy: Patient Spontanous Breathing and Patient connected to nasal cannula oxygen  Post-op Assessment: Report given to RN  Post vital signs: Reviewed and stable  Last Vitals:  Filed Vitals:   03/25/15 1515  BP: 139/72  Pulse: 79  Temp: 36.8 C  Resp: 20    Complications: No apparent anesthesia complications

## 2015-03-25 NOTE — Progress Notes (Signed)
Subjective: 1 Day Post-Op Procedure(s) (LRB): TOTAL KNEE ARTHROPLASTY (Right) Patient reports pain as severe.  No nausea/vomiting, lightheadedness/dizziness, chest pain/sob.  No flatus/bm.  Objective: Vital signs in last 24 hours: Temp:  [97.4 F (36.3 C)-99 F (37.2 C)] 99 F (37.2 C) (10/06 0522) Pulse Rate:  [68-92] 83 (10/06 0522) Resp:  [6-20] 16 (10/06 0522) BP: (117-169)/(79-132) 136/79 mmHg (10/06 0522) SpO2:  [94 %-100 %] 99 % (10/06 0522) Weight:  [409.811 kg (262 lb)] 118.842 kg (262 lb) (10/05 0944)  Intake/Output from previous day: 10/05 0701 - 10/06 0700 In: 3080 [P.O.:480; I.V.:2600] Out: 450 [Drains:450] Intake/Output this shift:     Recent Labs  03/25/15 0636  HGB 10.7*    Recent Labs  03/25/15 0636  WBC 12.9*  RBC 4.08  HCT 33.5*  PLT 264    Recent Labs  03/25/15 0636  NA 134*  K 3.4*  CL 99*  CO2 26  BUN 11  CREATININE 1.10*  GLUCOSE 116*  CALCIUM 8.8*   No results for input(s): LABPT, INR in the last 72 hours.  Neurologically intact Neurovascular intact Sensation intact distally Intact pulses distally Dorsiflexion/Plantar flexion intact Incision: scant drainage No cellulitis present Compartment soft  Unsuccessful attempt at pulling hemovac.  Majority of drain was pulled, but there is likely retained portion in joint/soft tissue.  Assessment/Plan: 1 Day Post-Op Procedure(s) (LRB): TOTAL KNEE ARTHROPLASTY (Right) Advance diet Up with therapy D/C IV fluids  WBAT RLE ABLA-mild and stable Please make patient NPO Stat x-ray right knee ordered Likely take to OR this afternoon for removal retained hemovac Dry dressing change prn  Otilio Saber 03/25/2015, 9:17 AM

## 2015-03-25 NOTE — Op Note (Signed)
[name], Katherine Gilmore NO.:  [id number]  MEDICAL RECORD NO.:  [id number]  LOCATION:  5N27C                        FACILITY:  [facility]  PHYSICIAN:  Loreta Ave, M.D. DATE OF BIRTH:  01-31-1971  DATE OF PROCEDURE:  03/24/2015 DATE OF DISCHARGE:                              OPERATIVE REPORT   PREOPERATIVE DIAGNOSIS:  End-stage arthritis, primary generalized, right knee.  POSTOPERATIVE DIAGNOSIS:  End-stage arthritis, primary generalized, right knee.  PROCEDURE:  Right knee modified minimally invasive total knee replacement with Stryker triathlon prosthesis.  Cemented-pegged cruciate retaining #4 femoral component.  Cemented #4 tibial component, 9 mm CS insert.  Cemented resurfacing 32-mm patellar component.  SURGEON:  Loreta Ave, M.D.  ASSISTANT:  Mikey Kirschner, P.A., present throughout the entire case and necessary for timely completion of procedure.  ANESTHESIA:  General.  BLOOD LOSS:  Minimal.  SPECIMENS:  None.  CULTURES:  None.  COMPLICATIONS:  None.  DRESSING:  Soft compressive knee immobilizer.  DRAINS:  Hemovac x1.  TOURNIQUET TIME:  45 minutes.  DESCRIPTION OF PROCEDURE:  The patient was brought to the operating room, placed on the operating table in a supine position.  After adequate anesthesia had been obtained, tourniquet applied.  Prepped and draped in usual sterile fashion.  Exsanguinated with elevation of Esmarch.  Tourniquet inflated to 350 mmHg.  Straight incision above the patella down to tibial tubercle.  Skin and subcutaneous tissue divided. Medial arthrotomy, vastus splitting.  Distal femur exposed.  Marked wear lateral femoral condyle, lateral tibial plateau.  An 8-mm resection, 5 degrees of valgus.  Most of the resection on the medial side.  Using epicondylar axis, the femur was sized, cut, and fitted for a pegged cruciate retaining #4 component.  Extramedullary guide proximal resection of the tibia.  A 3-degree  posterior slope cut.  Again most of the resection medially.  Sized to #4 component.  Rotation was set with trials.  9-mm CS insert.  Patella exposed.  Posterior 10 mm removed. Drilled, sized, and fitted for a 32-mm component.  Trials put in place. Very pleased with balancing, motion, flexion, extension, stability, and patellar tracking.  I had slightly externally rotated the tibial component to correct the increased Q angle.  All trials removed. Copious irrigation with a pulse irrigating device.  Cement prepared, placed on all components, firmly seated.  Polyethylene attached to tibia and knee reduced.  Patella held with a clamp.  Once cement hardened, the knee was irrigated once again.  Soft tissue was injected with Exparel. Hemovac was placed and brought out through a separate stab wound. Arthrotomy was closed with Vicryl with subcutaneous subcuticular closure.  Margins were injected with Marcaine.  Sterile compressive dressing applied.  Tourniquet deflated and removed.  Knee immobilizer applied.  Anesthesia reversed.  Brought to the recovery room.  Tolerated the surgery well.  No complications.     Loreta Ave, M.D.     DFM/MEDQ  D:  03/24/2015  T:  03/25/2015  Job:  [id number]

## 2015-03-25 NOTE — Care Management Note (Signed)
Case Management Note  Patient Details  Name: Katherine Gilmore MRN: [id number] Date of Birth: 25-Nov-1970  Subjective/Objective:         S/p right total knee arthroplasty           Action/Plan: Set up with Katherine Gilmore [facility] for HHPT by MD office. Spoke with patient, no change in discharge plan. Patient stated that she already has a rolling walker and 3N1 at home and [employer] delivered her CPM. Will continue to follow for d/c needs.  Expected Discharge Date:                  Expected Discharge Plan:  Home w Home Health Services  In-House Referral:  NA  Discharge planning Services  CM Consult  Post Acute Care Choice:  Durable Medical Equipment, Home Health Choice offered to:  Patient  DME Arranged:  3-N-1, Walker rolling, CPM DME Agency:  [employer]  HH Arranged:  PT HH Agency:  [employer]  Status of Service:  In process, will continue to follow  Medicare Important Message Given:    Date Medicare IM Given:    Medicare IM give by:    Date Additional Medicare IM Given:    Additional Medicare Important Message give by:     If discussed at Long Length of Stay Meetings, dates discussed:    Additional Comments:  Monica Becton, RN 03/25/2015, 2:51 PM

## 2015-03-25 NOTE — Anesthesia Procedure Notes (Signed)
Procedure Name: LMA Insertion Date/Time: 03/25/2015 5:06 PM Performed by: Jefm Miles E Pre-anesthesia Checklist: Patient identified, Emergency Drugs available, Suction available, Patient being monitored and Timeout performed Patient Re-evaluated:Patient Re-evaluated prior to inductionOxygen Delivery Method: Circle system utilized Preoxygenation: Pre-oxygenation with 100% oxygen Intubation Type: IV induction LMA: LMA inserted LMA Size: 4.0 Number of attempts: 1 Placement Confirmation: positive ETCO2 and breath sounds checked- equal and bilateral Tube secured with: Tape Dental Injury: Teeth and Oropharynx as per pre-operative assessment

## 2015-03-25 NOTE — Anesthesia Preprocedure Evaluation (Addendum)
Anesthesia Evaluation  Patient identified by MRN, date of birth, ID band Patient awake    Reviewed: Allergy & Precautions, NPO status , Patient's Chart, lab work & pertinent test results  History of Anesthesia Complications Negative for: history of anesthetic complications  Airway Mallampati: II  TM Distance: >3 FB Neck ROM: Full    Dental no notable dental hx. (+) Teeth Intact, Dental Advisory Given   Pulmonary neg pulmonary ROS,    Pulmonary exam normal breath sounds clear to auscultation       Cardiovascular hypertension, Normal cardiovascular exam Rhythm:Regular Rate:Normal     Neuro/Psych negative neurological ROS  negative psych ROS   GI/Hepatic negative GI ROS, Neg liver ROS,   Endo/Other  Morbid obesity  Renal/GU negative Renal ROS  negative genitourinary   Musculoskeletal negative musculoskeletal ROS (+)   Abdominal   Peds negative pediatric ROS (+)  Hematology negative hematology ROS (+)   Anesthesia Other Findings   Reproductive/Obstetrics negative OB ROS                            Anesthesia Physical  Anesthesia Plan  ASA: III  Anesthesia Plan: General   Post-op Pain Management:    Induction: Intravenous  Airway Management Planned: LMA  Additional Equipment:   Intra-op Plan:   Post-operative Plan: Extubation in OR  Informed Consent:   Plan Discussed with: Surgeon  Anesthesia Plan Comments:         Anesthesia Quick Evaluation

## 2015-03-25 NOTE — Op Note (Signed)
03/24/2015 - 03/25/2015  6:25 PM  PATIENT:  Katherine Gilmore    PRE-OPERATIVE DIAGNOSIS:  retained drain right knee  POST-OPERATIVE DIAGNOSIS:  Same  PROCEDURE:  FOREIGN BODY REMOVAL RIGHT KNEE  SURGEON:  Kindle Strohmeier, Jewel Baize, MD  ASSISTANT: Janalee Dane, PA-C, She was present and scrubbed throughout the case, critical for completion in a timely fashion, and for retraction, instrumentation, and closure.   ANESTHESIA:   gen  PREOPERATIVE INDICATIONS:  Katherine Gilmore is a  44 y.o. female with a diagnosis of retained drain right knee who failed conservative measures and elected for surgical management.    The risks benefits and alternatives were discussed with the patient preoperatively including but not limited to the risks of infection, bleeding, nerve injury, cardiopulmonary complications, the need for revision surgery, among others, and the patient was willing to proceed.  OPERATIVE IMPLANTS: none  OPERATIVE FINDINGS: removal of intact drain  BLOOD LOSS: min  COMPLICATIONS: none  TOURNIQUET TIME:  OPERATIVE PROCEDURE:  Patient was identified in the preoperative holding area and site was marked by me She was transported to the operating theater and placed on the table in supine position taking care to pad all bony prominences. After a preincinduction time out anesthesia was induced. The right lower extremity was prepped and draped in normal sterile fashion and a pre-incision timeout was performed. She received ancef for preoperative antibiotics.   I initially probed her drain site and did not feel the tip of the drain at this point.  I exsanguinated the limb and inflated the tourniquet to 300 mmHg.  I opened the superior most 6 cm of her incision by removing her Monocryl stitches. Identified the capsule and capsular coat closure.  I opened the superior aspect of the capsule was able to probe and feel the remnants of the drain that had been retained.  I removed the  drain and it came out hole.  I took an x-ray to confirm no additional retained piece of drain.  I thoroughly irrigated the knee with 3 L of saline.  I then performed a watertight closure of the capsule and closed the skin in layers finally placing a sterile dressing and taken to the PACU in stable condition.  POST OPERATIVE PLAN: Continue postoperative plan per Dr. Jesusita Oka Jones Viviani's plans with her.    This note was generated using a template and dragon dictation system. In light of that, I have reviewed the note and all aspects of it are applicable to this case. Any dictation errors are due to the computerized dictation system.

## 2015-03-25 NOTE — Progress Notes (Signed)
OT Cancellation Note  Patient Details Name: Katherine Gilmore FAIELLA MRN: [id number] DOB: 06/06/1971   Cancelled Treatment:    Reason Eval/Treat Not Completed: OT screened, no needs identified, will sign off. Per PT, pt had L TKA a few months ago. Has all the equipment that she needs at home and is comfortable completing ADLs. Mother to assist 24/7 upon d/c. Will sign off from an OT standpoint. Thank you for this referral.   Gaye Alken M.S., OTR/L Pager: [phone]  03/25/2015, 10:24 AM

## 2015-03-26 ENCOUNTER — Encounter (HOSPITAL_COMMUNITY): Payer: Self-pay | Admitting: Orthopedic Surgery

## 2015-03-26 LAB — BASIC METABOLIC PANEL WITH GFR
Anion gap: 10 (ref 5–15)
BUN: 13 mg/dL (ref 6–20)
CO2: 27 mmol/L (ref 22–32)
Calcium: 8.7 mg/dL — ABNORMAL LOW (ref 8.9–10.3)
Chloride: 100 mmol/L — ABNORMAL LOW (ref 101–111)
Creatinine, Ser: 1.03 mg/dL — ABNORMAL HIGH (ref 0.44–1.00)
GFR calc Af Amer: >60 mL/min
GFR calc non Af Amer: >60 mL/min
Glucose, Bld: 119 mg/dL — ABNORMAL HIGH (ref 65–99)
Potassium: 3.8 mmol/L (ref 3.5–5.1)
Sodium: 137 mmol/L (ref 135–145)

## 2015-03-26 LAB — CBC
HCT: 27.9 % — ABNORMAL LOW (ref 36.0–46.0)
Hemoglobin: 8.8 g/dL — ABNORMAL LOW (ref 12.0–15.0)
MCH: 25.9 pg — ABNORMAL LOW (ref 26.0–34.0)
MCHC: 31.5 g/dL (ref 30.0–36.0)
MCV: 82.1 fL (ref 78.0–100.0)
Platelets: 251 K/uL (ref 150–400)
RBC: 3.4 MIL/uL — ABNORMAL LOW (ref 3.87–5.11)
RDW: 13.8 % (ref 11.5–15.5)
WBC: 13.5 K/uL — ABNORMAL HIGH (ref 4.0–10.5)

## 2015-03-26 NOTE — Progress Notes (Signed)
Physical Therapy Treatment Patient Details Name: Katherine Gilmore MRN: [id number] DOB: 01-12-1971 Today's Date: 03/26/2015    History of Present Illness 44 y/o female s/p R TKA (03/24/15). Pt had L TKA in July with good results.    PT Comments    Patient is making good progress with PT.  From a mobility standpoint anticipate patient will be ready for DC home with family assistance. Patient denies any questions or concerns following session.   .     Follow Up Recommendations  Home health PT     Equipment Recommendations  None recommended by PT    Recommendations for Other Services       Precautions / Restrictions Precautions Precautions: None Restrictions Weight Bearing Restrictions: Yes RLE Weight Bearing: Weight bearing as tolerated    Mobility  Bed Mobility Overal bed mobility: Needs Assistance Bed Mobility: Supine to Sit     Supine to sit: Independent     General bed mobility comments: no assistance needed supine to sit  Transfers Overall transfer level: Needs assistance Equipment used: Rolling walker (2 wheeled) Transfers: Sit to/from Stand Sit to Stand: Supervision         General transfer comment: from bed  Ambulation/Gait Ambulation/Gait assistance: Supervision Ambulation Distance (Feet): 150 Feet Assistive device: Rolling walker (2 wheeled) Gait Pattern/deviations: Step-through pattern;Decreased weight shift to right Gait velocity: decreased   General Gait Details: cues for knee flexion and decreasing plantarflexion with late stance, no loss of balance or instabiliy   Stairs         General stair comments: declined stairs, reports feeling confident  Wheelchair Mobility    Modified Rankin (Stroke Patients Only)       Balance Overall balance assessment: Needs assistance Sitting-balance support: No upper extremity supported Sitting balance-Leahy Scale: Good     Standing balance support: During functional activity Standing  balance-Leahy Scale: Fair Standing balance comment: using rw                    Cognition Arousal/Alertness: Awake/alert Behavior During Therapy: WFL for tasks assessed/performed Overall Cognitive Status: Within Functional Limits for tasks assessed                      Exercises Total Joint Exercises Ankle Circles/Pumps: AROM;Both;10 reps Quad Sets: Strengthening;Right;10 reps Short Arc Quad: Strengthening;Right;10 reps Heel Slides: AAROM;Right;10 reps Hip ABduction/ADduction: Strengthening;Right;10 reps Straight Leg Raises: Strengthening;Right;5 reps (mod assist) Long Arc Quad: Strengthening;Right;5 reps Knee Flexion: AROM;Right;Seated;10 reps Goniometric ROM: 88 degrees flexion    General Comments        Pertinent Vitals/Pain Pain Assessment: No/denies pain Pain Score: 0-No pain Pain Descriptors / Indicators:  (no pain, just really sore)    Home Living                      Prior Function            PT Goals (current goals can now be found in the care plan section) Acute Rehab PT Goals Patient Stated Goal: go home PT Goal Formulation: With patient/family Time For Goal Achievement: 04/01/15 Potential to Achieve Goals: Good Progress towards PT goals: Progressing toward goals    Frequency  7X/week    PT Plan Current plan remains appropriate    Co-evaluation             End of Session Equipment Utilized During Treatment: Gait belt Activity Tolerance: Patient tolerated treatment well Patient left: in chair;with call bell/phone within  reach;with family/visitor present     Time: 0929-0959 PT Time Calculation (min) (ACUTE ONLY): 30 min  Charges:  $Gait Training: 8-22 mins $Therapeutic Exercise: 8-22 mins                    G Codes:      Christiane Ha, PT, CSCS Pager [phone] Office [phone]  03/26/2015, 10:10 AM

## 2015-03-26 NOTE — Progress Notes (Signed)
Subjective: 1 Day Post-Op Procedure(s) (LRB): FOREIGN BODY REMOVAL RIGHT KNEE (Right) Patient reports pain as mild.  No nausea/vomiting, lightheadedness/dizziness, chest pain/sob.  Negative flatus/bm.  Tolerating diet.  Objective: Vital signs in last 24 hours: Temp:  [97.2 F (36.2 C)-98.7 F (37.1 C)] 98.6 F (37 C) (10/07 0548) Pulse Rate:  [68-90] 77 (10/07 0548) Resp:  [9-20] 16 (10/07 0548) BP: (116-154)/(72-98) 116/76 mmHg (10/07 0548) SpO2:  [90 %-100 %] 97 % (10/07 0548)  Intake/Output from previous day: 10/06 0701 - 10/07 0700 In: 1026.7 [P.O.:240; I.V.:786.7] Out: 300 [Urine:300] Intake/Output this shift:     Recent Labs  03/25/15 0636 03/26/15 0439  HGB 10.7* 8.8*    Recent Labs  03/25/15 0636 03/26/15 0439  WBC 12.9* 13.5*  RBC 4.08 3.40*  HCT 33.5* 27.9*  PLT 264 251    Recent Labs  03/25/15 0636 03/26/15 0439  NA 134* 137  K 3.4* 3.8  CL 99* 100*  CO2 26 27  BUN 11 13  CREATININE 1.10* 1.03*  GLUCOSE 116* 119*  CALCIUM 8.8* 8.7*   No results for input(s): LABPT, INR in the last 72 hours.  Neurologically intact Neurovascular intact Sensation intact distally Intact pulses distally Dorsiflexion/Plantar flexion intact Compartment soft  Assessment/Plan: 1 Day Post-Op Procedure(s) (LRB): FOREIGN BODY REMOVAL RIGHT KNEE (Right) Advance diet Up with therapy D/C IV fluids Discharge home with home health following AM PT session WBAT RLE ABLA-mild and stable Dry dressing change prn  Otilio Saber 03/26/2015, 8:14 AM

## 2015-03-31 ENCOUNTER — Ambulatory Visit (HOSPITAL_COMMUNITY)
Admission: RE | Admit: 2015-03-31 | Discharge: 2015-03-31 | Disposition: A | Discharge status: Home / Self Care | Payer: BLUE CROSS/BLUE SHIELD | Source: Ambulatory Visit | Attending: Cardiovascular Disease | Admitting: Cardiovascular Disease

## 2015-03-31 ENCOUNTER — Other Ambulatory Visit (HOSPITAL_COMMUNITY): Payer: Self-pay | Admitting: Orthopedic Surgery

## 2015-03-31 DIAGNOSIS — M7989 Other specified soft tissue disorders: Secondary | ICD-10-CM | POA: Insufficient documentation

## 2015-03-31 DIAGNOSIS — M79604 Pain in right leg: Secondary | ICD-10-CM | POA: Diagnosis not present

## 2015-04-26 ENCOUNTER — Ambulatory Visit: Payer: BLUE CROSS/BLUE SHIELD | Attending: Orthopedic Surgery | Admitting: Physical Therapy

## 2015-04-26 DIAGNOSIS — R29898 Other symptoms and signs involving the musculoskeletal system: Secondary | ICD-10-CM | POA: Diagnosis present

## 2015-04-26 DIAGNOSIS — M25561 Pain in right knee: Secondary | ICD-10-CM | POA: Insufficient documentation

## 2015-04-26 DIAGNOSIS — M7989 Other specified soft tissue disorders: Secondary | ICD-10-CM | POA: Diagnosis present

## 2015-04-26 DIAGNOSIS — R269 Unspecified abnormalities of gait and mobility: Secondary | ICD-10-CM | POA: Diagnosis present

## 2015-04-26 DIAGNOSIS — M25661 Stiffness of right knee, not elsewhere classified: Secondary | ICD-10-CM | POA: Insufficient documentation

## 2015-04-26 DIAGNOSIS — M25662 Stiffness of left knee, not elsewhere classified: Secondary | ICD-10-CM | POA: Diagnosis present

## 2015-04-26 NOTE — Therapy (Signed)
[facility] Outpatient Rehabilitation [facility] [address]  [address] [place], [place], [address] Phone: [phone]   Fax:  [phone]  Physical Therapy Evaluation  Patient Details  Name: Katherine Gilmore MRN: [id number] Date of Birth: 02/16/1971 Referring Provider: Loreta Ave, MD  Encounter Date: 04/26/2015      PT End of Session - 04/26/15 0930    Visit Number 1   Number of Visits 8   Date for PT Re-Evaluation 05/24/15   PT Start Time 0848   PT Stop Time 0932   PT Time Calculation (min) 44 min   Activity Tolerance Patient tolerated treatment well   Behavior During Therapy [facility] for tasks assessed/performed      Past Medical History  Diagnosis Date  . Hypertension     not on BP meds currently    Past Surgical History  Procedure Laterality Date  . Breast reduction surgery    . Uterine fibroid surgery    . Cesarean section    . Hernia repair      as a baby  . Abdominal hysterectomy    . Total knee arthroplasty Left 01/06/2015    Procedure: TOTAL KNEE ARTHROPLASTY;  Surgeon: Mckinley Jewel, MD;  Location: [facility] OR;  Service: Orthopedics;  Laterality: Left;  . Total knee arthroplasty Right 03/24/2015    Procedure: TOTAL KNEE ARTHROPLASTY;  Surgeon: Loreta Ave, MD;  Location: [facility] OR;  Service: Orthopedics;  Laterality: Right;  . I&d extremity Right 03/25/2015    Procedure: FOREIGN BODY REMOVAL RIGHT KNEE;  Surgeon: Sheral Apley, MD;  Location: [facility] OR;  Service: Orthopedics;  Laterality: Right;  removal section hemovac drain tubing    There were no vitals filed for this visit.  Visit Diagnosis:  Stiffness of knee joint, left  Swelling of limb  Abnormality of gait  Weakness of right leg  Right knee pain      Subjective Assessment - 04/26/15 0851    Subjective Patient reports this surgery was "a little different than last time" - the doctor had to go back in and remove a piece of the drain that did not pull out the way it was supposed  to. Patient reports minimal to no pain in knee, but states "just uncomfortable" with most discomfort at night. Also reporting sporadic cramping in right calf.   Pertinent History Right TKR 03/24/15, Left TKR 01/06/15   Currently in Pain? No/denies   Pain Score 0-No pain  Least 0/10, Avg 5/10, Worst 8/10   Pain Location Knee   Pain Orientation Right;Anterior   Pain Descriptors / Indicators Discomfort   Pain Onset More than a month ago   Pain Frequency Intermittent   Aggravating Factors  None identified   Pain Relieving Factors Ice, pain meds   Effect of Pain on Daily Activities None reported            [facility] PT Assessment - 04/26/15 0848    Assessment   Medical Diagnosis Right TKR   Referring Provider Loreta Ave, MD   Onset Date/Surgical Date 03/24/15   Next MD Visit 05/25/15   Prior Therapy HH PT x 2 weeks   Balance Screen   Has the patient fallen in the past 6 months No   Has the patient had a decrease in activity level because of a fear of falling?  No   Is the patient reluctant to leave their home because of a fear of falling?  No   Home Environment  Living Environment Private residence   Type of Home Apartment  Condo   Home Access Stairs to enter   Entrance Stairs-Number of Steps 12   Entrance Stairs-Rails Right;Left;Cannot reach both   Home Layout One level   Observation/Other Assessments   Focus on Therapeutic Outcomes (FOTO)  Knee 46% (54% limitation); Prediced 60% (40% limitation)   ROM / Strength   AROM / PROM / Strength AROM;PROM;Strength   AROM   AROM Assessment Site Knee   Right/Left Knee Right;Left   Right Knee Extension 18   Right Knee Flexion 104   Left Knee Extension 0   Left Knee Flexion 122   PROM   PROM Assessment Site Knee   Right/Left Knee Right   Right Knee Extension 2   Right Knee Flexion 110   Strength   Strength Assessment Site Hip;Knee   Right/Left Hip Right;Left   Right Hip Flexion 3+/5   Right Hip Extension 3+/5   Right Hip  ABduction 4-/5   Right Hip ADduction 3+/5   Left Hip Flexion 4+/5   Left Hip Extension 4/5   Left Hip ABduction 4+/5   Left Hip ADduction 4+/5   Right/Left Knee Right;Left   Right Knee Flexion 3+/5   Right Knee Extension 3-/5   Left Knee Flexion --  5-/5   Left Knee Extension 5/5   Palpation   Patella mobility WNL   Palpation comment mild edema in anterior knee, good incisional mobility with just mild restictions at distal incision   Ambulation/Gait   Stairs Yes   Stairs Assistance 6: Modified independent (Device/Increase time)   Stair Management Technique One rail Right;Step to pattern;Forwards                [facility] Adult PT Treatment/Exercise - 04/26/15 0848    Ambulation/Gait   Gait Pattern Step-through pattern;Decreased stance time - right;Decreased hip/knee flexion - right;Decreased weight shift to right;Antalgic   Ambulation Surface Level   Exercises   Exercises Knee/Hip   Knee/Hip Exercises: Standing   Terminal Knee Extension Strengthening;Right;10 reps   Terminal Knee Extension Limitations small ball behind knee against wall   Knee/Hip Exercises: Supine   Short Arc Quad Sets Strengthening;Right;10 reps;2 sets   Hip Adduction Isometric Strengthening;Both;10 reps   Hip Adduction Isometric Limitations small ball between knees   Bridges with Beacher MayBall Squeeze Strengthening;Both;10 reps   Straight Leg Raises Strengthening;Right;10 reps                PT Education - 04/26/15 [phone]    Education provided Yes   Education Details PT POC and initial HEP   Person(s) Educated Patient   Methods Explanation;Demonstration;Handout   Comprehension Verbalized understanding;Returned demonstration;Need further instruction             PT Long Term Goals - 04/26/15 1046    PT LONG TERM GOAL #1   Title Independent with HEP (05/24/15)   Time 4   Period Weeks   Status New   PT LONG TERM GOAL #2   Title Right knee AROM 2-120 to allow for normal biomechanics with gait  and stair climbing (05/24/15)   Time 4   Period Weeks   Status New   PT LONG TERM GOAL #3   Title Demo right LE strength 4/5 or greater to improve function and stability (05/24/15)   Time 4   Period Weeks   Status New   PT LONG TERM GOAL #4   Title Ambulate with normal gait pattern on all surfaces (05/24/15)  Time 4   Period Weeks   Status New   PT LONG TERM GOAL #5   Title Ascend/descend stairs with reciprocal gait pattern (05/24/15)   Time 4   Period Weeks   Status New               Plan - 04/26/15 1049    Clinical Impression Statement Patient is a 44 y/o female who presents to OP PT ~1 month s/p right TKR on 03/24/15 (previously underwent left TKR on 01/06/15 with good results). Upon evaulation, patient arrived to therapy wihout AD having already weaned herself off the [facility]. Patient demonstrates slight antalgic gait on right with decreased weight to left and decreased right knee ROM during stride. Right knee AROM is 18-104 and PROM 2-110. Weakness noted in right hip and knee musculature averaging 3+/5 with decreased VMO activation and quad lag present. Patient reports stair ascent/descent with step-to pattern secondary to complaints of right knee instability, especially on descent.   Pt will benefit from skilled therapeutic intervention in order to improve on the following deficits Decreased range of motion;Impaired flexibility;Pain;Decreased strength;Difficulty walking;Abnormal gait;Increased edema;Decreased scar mobility;Decreased activity tolerance   Rehab Potential Good   PT Frequency 2x / week   PT Duration 4 weeks   PT Treatment/Interventions Therapeutic exercise;Manual techniques;Passive range of motion;Therapeutic activities;Gait training;Stair training;Electrical Stimulation;Cryotherapy;Vasopneumatic Device;Taping;Balance training;Neuromuscular re-education;Patient/family education   PT Next Visit Plan Review HEP, TKR protocol   Consulted and Agree with Plan of Care Patient          Problem List Patient Active Problem List   Diagnosis Date Noted  . DJD (degenerative joint disease) of knee 01/06/2015    Marry Guan, PT, MPT 04/26/2015, 11:08 AM  [facility] [address]  [address] [place], [place], [address] Phone: [phone]   Fax:  [phone]  Name: Katherine Gilmore MRN: [id number] Date of Birth: [birth year]

## 2015-04-28 ENCOUNTER — Ambulatory Visit: Payer: BLUE CROSS/BLUE SHIELD | Admitting: Physical Therapy

## 2015-04-28 DIAGNOSIS — M25661 Stiffness of right knee, not elsewhere classified: Secondary | ICD-10-CM | POA: Diagnosis not present

## 2015-04-28 DIAGNOSIS — M25561 Pain in right knee: Secondary | ICD-10-CM

## 2015-04-28 DIAGNOSIS — M7989 Other specified soft tissue disorders: Secondary | ICD-10-CM

## 2015-04-28 DIAGNOSIS — R29898 Other symptoms and signs involving the musculoskeletal system: Secondary | ICD-10-CM

## 2015-04-28 NOTE — Therapy (Signed)
[facility] Outpatient Rehabilitation [facility] [address]  [address] [place], [place], [address] Phone: [phone]   Fax:  [phone]  Physical Therapy Treatment  Patient Details  Name: Katherine Gilmore MRN: [id number] Date of Birth: [birth year] Referring Provider: Loreta Ave, MD  Encounter Date: 04/28/2015      PT End of Session - 04/28/15 1153    Visit Number 2   Number of Visits 8   Date for PT Re-Evaluation 05/24/15   PT Start Time 1148   PT Stop Time 1237   PT Time Calculation (min) 49 min   Activity Tolerance Patient tolerated treatment well   Behavior During Therapy [facility] for tasks assessed/performed      Past Medical History  Diagnosis Date  . Hypertension     not on BP meds currently    Past Surgical History  Procedure Laterality Date  . Breast reduction surgery    . Uterine fibroid surgery    . Cesarean section    . Hernia repair      as a baby  . Abdominal hysterectomy    . Total knee arthroplasty Left 01/06/2015    Procedure: TOTAL KNEE ARTHROPLASTY;  Surgeon: Mckinley Jewel, MD;  [facility]: [facility] OR;  Service: Orthopedics;  Laterality: Left;  . Total knee arthroplasty Right 03/24/2015    Procedure: TOTAL KNEE ARTHROPLASTY;  Surgeon: Loreta Ave, MD;  Location: [facility] OR;  Service: Orthopedics;  Laterality: Right;  . I&d extremity Right 03/25/2015    Procedure: FOREIGN BODY REMOVAL RIGHT KNEE;  Surgeon: Sheral Apley, MD;  Location: [facility] OR;  Service: Orthopedics;  Laterality: Right;  removal section hemovac drain tubing    There were no vitals filed for this visit.  Visit Diagnosis:  Stiffness of knee joint, right  Weakness of right leg  Right knee pain  Swelling of limb      Subjective Assessment - 04/28/15 1153    Subjective Patient reports completing HEP "a couple times a day" without any problems.   Patient Stated Goals "To get the strength back in my knee"   Currently in Pain? Yes   Pain Score 1    Pain Location  Knee   Pain Orientation Right                  [facility] Adult PT Treatment/Exercise - 04/28/15 1148    Exercises   Exercises Knee/Hip   Knee/Hip Exercises: Standing   Heel Raises Both;15 reps;3 seconds   Heel Raises Limitations Standing on blue foam, 2 pole A   Hip Flexion Both;15 reps;Knee bent   Hip Flexion Limitations Marching on blue foam, 2 pole A   Terminal Knee Extension Strengthening;Right;10 reps;2 sets   Terminal Knee Extension Limitations 5" hold with small ball behind knee against wall   Hip Abduction Both;15 reps;Knee straight   Abduction Limitations Alternating on blue foam, 2 pole A   Hip Extension Both;15 reps;Knee straight   Extension Limitations Alternating on blue foam, 2 pole A   Functional Squat 10 reps;1 set;3 seconds   Functional Squat Limitations TRX   Knee/Hip Exercises: Seated   Hamstring Curl Strengthening;Right;10 reps;2 sets   Hamstring Limitations Green TB   Knee/Hip Exercises: Supine   Short Arc Quad Sets Strengthening;Right;10 reps;2 sets   Short Arc Quad Sets Limitations 2#   Hip Adduction Isometric Strengthening;Both;10 reps;2 sets   Hip Adduction Isometric Limitations 5" hold with small ball between knees   Bridges with [employer] Strengthening;Both;10 reps;2  sets   Straight Leg Raises Strengthening;Right;10 reps;2 sets   Straight Leg Raises Limitations 2#   Modalities   Modalities Vasopneumatic   Vasopneumatic   Number Minutes Vasopneumatic  15 minutes   Vasopnuematic Location  Knee   Vasopneumatic Pressure Medium   Vasopneumatic Temperature  Lowest                  PT Long Term Goals - 04/26/15 1046    PT LONG TERM GOAL #1   Title Independent with HEP (05/24/15)   Time 4   Period Weeks   Status New   PT LONG TERM GOAL #2   Title Right knee AROM 2-120 to allow for normal biomechanics with gait and stair climbing (05/24/15)   Time 4   Period Weeks   Status New   PT LONG TERM GOAL #3   Title Demo right LE strength  4/5 or greater to improve function and stability (05/24/15)   Time 4   Period Weeks   Status New   PT LONG TERM GOAL #4   Title Ambulate with normal gait pattern on all surfaces (05/24/15)   Time 4   Period Weeks   Status New   PT LONG TERM GOAL #5   Title Ascend/descend stairs with reciprocal gait pattern (05/24/15)   Time 4   Period Weeks   Status New               Plan - 04/28/15 1221    Clinical Impression Statement Patient able to demonstrate all HEP exercises appropriately and tolerated addition of 2# cuff weight for some exercises. Initiated standing hip exercises on blue foam for strengthening and stability training with good tolerance. Patient fatigued by end of therapy session. Vasopnuematic compression at coldest temp applied after exercises to minimize swelling.   PT Next Visit Plan TKR protocol        Problem List Patient Active Problem List   Diagnosis Date Noted  . DJD (degenerative joint disease) of knee 01/06/2015    Marry GuanJoAnne M Kreis, PT, MPT 04/28/2015, 12:39 PM  [facility] [address]  [address] [place], [place], [address] Phone: [phone]   Fax:  [phone]  Name: Delbert Harnessiffany Y Rayson MRN: [id number] Date of Birth: 12-Mar-1971

## 2015-05-04 ENCOUNTER — Ambulatory Visit: Payer: BLUE CROSS/BLUE SHIELD | Admitting: Physical Therapy

## 2015-05-04 DIAGNOSIS — M25661 Stiffness of right knee, not elsewhere classified: Secondary | ICD-10-CM

## 2015-05-04 DIAGNOSIS — M25561 Pain in right knee: Secondary | ICD-10-CM

## 2015-05-04 DIAGNOSIS — R29898 Other symptoms and signs involving the musculoskeletal system: Secondary | ICD-10-CM

## 2015-05-04 DIAGNOSIS — M7989 Other specified soft tissue disorders: Secondary | ICD-10-CM

## 2015-05-04 DIAGNOSIS — R269 Unspecified abnormalities of gait and mobility: Secondary | ICD-10-CM

## 2015-05-04 NOTE — Therapy (Signed)
[facility] [address]  [address] [place], [place], [address] Phone: [phone]   Fax:  [phone]  Physical Therapy Treatment  Patient Details  Name: Katherine Gilmore MRN: [id number] Date of Birth: 1971-01-03 Referring Provider: Loreta Aveaniel F Murphy, MD  Encounter Date: 05/04/2015      PT End of Session - 05/04/15 1537    Visit Number 3   Number of Visits 8   Date for PT Re-Evaluation 05/24/15   PT Start Time 1533   PT Stop Time 1628   PT Time Calculation (min) 55 min   Activity Tolerance Patient tolerated treatment well   Behavior During Therapy [facility] for tasks assessed/performed      Past Medical History  Diagnosis Date  . Hypertension     not on BP meds currently    Past Surgical History  Procedure Laterality Date  . Breast reduction surgery    . Uterine fibroid surgery    . Cesarean section    . Hernia repair      as a baby  . Abdominal hysterectomy    . Total knee arthroplasty Left 01/06/2015    Procedure: TOTAL KNEE ARTHROPLASTY;  Surgeon: Mckinley Jewelaniel Murphy, MD;  [facility];  Service: Orthopedics;  Laterality: Left;  . Total knee arthroplasty Right 03/24/2015    Procedure: TOTAL KNEE ARTHROPLASTY;  Surgeon: Loreta Aveaniel F Murphy, MD;  [facility];  Service: Orthopedics;  Laterality: Right;  . I&d extremity Right 03/25/2015    Procedure: FOREIGN BODY REMOVAL RIGHT KNEE;  Surgeon: Sheral Apleyimothy D Murphy, MD;  Location: [facility] OR;  Service: Orthopedics;  Laterality: Right;  removal section hemovac drain tubing    There were no vitals filed for this visit.  Visit Diagnosis:  Stiffness of knee joint, right  Weakness of right leg  Right knee pain  Swelling of limb  Abnormality of gait      Subjective Assessment - 05/04/15 1535    Subjective Patient denies pain, but states "both knees are just sore". Reports doing alot of walking but admits to limited compliance with HEP.            [facility] Assessment -  05/04/15 1533    ROM / Strength   AROM / PROM / Strength AROM   AROM   AROM Assessment Site Knee   Right/Left Knee Right   Right Knee Extension 18   Right Knee Flexion 112   PROM   PROM Assessment Site Knee   Right/Left Knee Right   Right Knee Extension 0   Right Knee Flexion 112                     [facility] Adult PT Treatment/Exercise - 05/04/15 1533    Exercises   Exercises Knee/Hip   Knee/Hip Exercises: Stretches   Knee: Self-Stretch to increase Flexion Right;10 seconds;5 reps   Knee: Self-Stretch Limitations Step stretch on 9" step   Knee/Hip Exercises: Aerobic   Recumbent Bike lvl 2 x 6'   Knee/Hip Exercises: Machines for Strengthening   Cybex Leg Press DL 69#25# [id number]   Knee/Hip Exercises: Standing   Lateral Step Up Right;15 reps;Hand Hold: 1;Step Height: 6"   Forward Step Up Right;15 reps;Step Height: 6";Hand Hold: 0   Functional Squat 10 reps;3 seconds;2 sets   Functional Squat Limitations TRX   Knee/Hip Exercises: Supine   Short Arc Quad Sets Strengthening;Right;10 reps;2 sets   Short Arc Quad Sets Limitations 3#   Hip Adduction  Isometric Strengthening;Both;10 reps   Hip Adduction Isometric Limitations 5" hold with small ball between knees   Bridges with [employer] Squeeze Strengthening;Both;10 reps   Straight Leg Raises Strengthening;Right;15 reps;1 set   Straight Leg Raises Limitations 3#   Knee Flexion AAROM;Both;15 reps   Knee Flexion Limitations peanut ball tuck   Other Supine Knee/Hip Exercises Alternating quad set/hip ext isomtertic into peanut ball 15x3"   Modalities   Modalities Vasopneumatic   Vasopneumatic   Number Minutes Vasopneumatic  10 minutes   Vasopnuematic Location  Knee   Vasopneumatic Pressure Medium   Vasopneumatic Temperature  Lowest                     PT Long Term Goals - 05/04/15 1824    PT LONG TERM GOAL #1   Title Independent with HEP (05/24/15)   Status On-going   PT LONG TERM GOAL #2   Title Right knee AROM  2-120 to allow for normal biomechanics with gait and stair climbing (05/24/15)   Status On-going   PT LONG TERM GOAL #3   Title Demo right LE strength 4/5 or greater to improve function and stability (05/24/15)   Status On-going   PT LONG TERM GOAL #4   Title Ambulate with normal gait pattern on all surfaces (05/24/15)   Status On-going   PT LONG TERM GOAL #5   Title Ascend/descend stairs with reciprocal gait pattern (05/24/15)   Status On-going               Plan - 05/04/15 1824    Clinical Impression Statement Right knee ROM progressing well but strength gains remain slower, in part to patient's limited compliance with HEP. Reinforced need for carryover outside of therapy sessions to achieve progress toward goals.   PT Next Visit Plan TKR protocol   Consulted and Agree with Plan of Care Patient        Problem List Patient Active Problem List   Diagnosis Date Noted  . DJD (degenerative joint disease) of knee 01/06/2015    Marry Guan, PT, MPT 05/04/2015, 6:28 PM  [facility] [address]  [address] [place], [place], [address] Phone: [phone]   Fax:  [phone]  Name: Katherine Gilmore MRN: [id number] Date of Birth: December 04, 1970

## 2015-05-07 ENCOUNTER — Ambulatory Visit: Payer: BLUE CROSS/BLUE SHIELD | Admitting: Physical Therapy

## 2015-05-07 DIAGNOSIS — M7989 Other specified soft tissue disorders: Secondary | ICD-10-CM

## 2015-05-07 DIAGNOSIS — M25561 Pain in right knee: Secondary | ICD-10-CM

## 2015-05-07 DIAGNOSIS — R29898 Other symptoms and signs involving the musculoskeletal system: Secondary | ICD-10-CM

## 2015-05-07 DIAGNOSIS — M25661 Stiffness of right knee, not elsewhere classified: Secondary | ICD-10-CM | POA: Diagnosis not present

## 2015-05-07 DIAGNOSIS — R269 Unspecified abnormalities of gait and mobility: Secondary | ICD-10-CM

## 2015-05-07 NOTE — Therapy (Signed)
[facility] Outpatient Rehabilitation [facility] [address]  [address] [place], [place], [address] Phone: [phone]   Fax:  [phone]  Physical Therapy Treatment  Patient Details  Name: Katherine Gilmore MRN: [id number] Date of Birth: 1970/08/05 Referring Provider: Loreta Ave, MD  Encounter Date: 05/07/2015      PT End of Session - 05/07/15 1112    Visit Number 4   Number of Visits 8   Date for PT Re-Evaluation 05/24/15   PT Start Time 1102   PT Stop Time 1158   PT Time Calculation (min) 56 min   Activity Tolerance Patient tolerated treatment well   Behavior During Therapy [facility] for tasks assessed/performed      Past Medical History  Diagnosis Date  . Hypertension     not on BP meds currently    Past Surgical History  Procedure Laterality Date  . Breast reduction surgery    . Uterine fibroid surgery    . Cesarean section    . Hernia repair      as a baby  . Abdominal hysterectomy    . Total knee arthroplasty Left 01/06/2015    Procedure: TOTAL KNEE ARTHROPLASTY;  Surgeon: Mckinley Jewel, MD;  Location: [facility] OR;  Service: Orthopedics;  Laterality: Left;  . Total knee arthroplasty Right 03/24/2015    Procedure: TOTAL KNEE ARTHROPLASTY;  Surgeon: Loreta Ave, MD;  Location: [facility] OR;  Service: Orthopedics;  Laterality: Right;  . I&d extremity Right 03/25/2015    Procedure: FOREIGN BODY REMOVAL RIGHT KNEE;  Surgeon: Sheral Apley, MD;  Location: [facility] OR;  Service: Orthopedics;  Laterality: Right;  removal section hemovac drain tubing    There were no vitals filed for this visit.  Visit Diagnosis:  Stiffness of knee joint, right  Weakness of right leg  Right knee pain  Swelling of limb  Abnormality of gait      Subjective Assessment - 05/07/15 1106    Subjective Patient states knee is hurting more today, enough that she had to take some pain meds before therapy. Did state that she had been much busier than usual including moving  furniture around to clean carpets.   Currently in Pain? Yes   Pain Score 4    Pain Orientation Right            [facility] PT Assessment - 05/07/15 0001    ROM / Strength   AROM / PROM / Strength AROM   AROM   AROM Assessment Site Knee   Right/Left Knee Right   Right Knee Extension 14   Right Knee Flexion 114                 [facility] Adult PT Treatment/Exercise - 05/07/15 1102    Exercises   Exercises Knee/Hip   Knee/Hip Exercises: Aerobic   Recumbent Bike lvl 2 x 6'   Knee/Hip Exercises: Machines for Strengthening   Cybex Knee Extension 15# x15   Cybex Knee Flexion 15# x15   Cybex Leg Press DL 69# G29, 52# W41   Knee/Hip Exercises: Standing   Hip Flexion Right;15 reps;Knee straight   Hip Flexion Limitations Green TB   Hip ADduction Right;15 reps   Hip ADduction Limitations Green TB   Hip Abduction Right;15 reps;Knee straight   Abduction Limitations Green TB   Hip Extension Right;15 reps;Knee straight   Extension Limitations Green TB   Lateral Step Up Right;15 reps;Hand Hold: 1;Step Height: 6"   Forward Step Up Right;15 reps;Hand  Hold: 1;Step Height: 6"   Functional Squat 15 reps;3 seconds;2 sets   Functional Squat Limitations TRX   Other Standing Knee Exercises TM hamstring pull x10   Modalities   Modalities Vasopneumatic   Vasopneumatic   Number Minutes Vasopneumatic  15 minutes   Vasopnuematic Location  Knee   Vasopneumatic Pressure Medium   Vasopneumatic Temperature  Lowest                PT Long Term Goals - 05/04/15 1824    PT LONG TERM GOAL #1   Title Independent with HEP (05/24/15)   Status On-going   PT LONG TERM GOAL #2   Title Right knee AROM 2-120 to allow for normal biomechanics with gait and stair climbing (05/24/15)   Status On-going   PT LONG TERM GOAL #3   Title Demo right LE strength 4/5 or greater to improve function and stability (05/24/15)   Status On-going   PT LONG TERM GOAL #4   Title Ambulate with normal gait pattern on  all surfaces (05/24/15)   Status On-going   PT LONG TERM GOAL #5   Title Ascend/descend stairs with reciprocal gait pattern (05/24/15)   Status On-going               Plan - 05/07/15 1203    Clinical Impression Statement Patient demonstrating improving gait pattern with decreased compensation/substitution. Increased pain reported today from increased activity at home but tolerated all exercises well including progression of exercises.   PT Next Visit Plan TKR protocol   Consulted and Agree with Plan of Care Patient        Problem List Patient Active Problem List   Diagnosis Date Noted  . DJD (degenerative joint disease) of knee 01/06/2015    Marry GuanJoAnne M Kreis, PT, MPT 05/07/2015, 12:09 PM  [facility] [address]  [address] [place], [place], [address] Phone: [phone]   Fax:  [phone]  Name: Katherine Gilmore MRN: [id number] Date of Birth: 03-03-1971

## 2015-05-10 ENCOUNTER — Ambulatory Visit: Payer: BLUE CROSS/BLUE SHIELD | Admitting: Physical Therapy

## 2015-05-10 DIAGNOSIS — M25661 Stiffness of right knee, not elsewhere classified: Secondary | ICD-10-CM | POA: Diagnosis not present

## 2015-05-10 DIAGNOSIS — R269 Unspecified abnormalities of gait and mobility: Secondary | ICD-10-CM

## 2015-05-10 DIAGNOSIS — R29898 Other symptoms and signs involving the musculoskeletal system: Secondary | ICD-10-CM

## 2015-05-10 NOTE — Therapy (Signed)
[facility] Outpatient Rehabilitation [facility] [address]  [address] [place], [place], [address] Phone: [phone]   Fax:  [phone]  Physical Therapy Treatment  Patient Details  Name: BLENDA WISECUP MRN: [id number] Date of Birth: [birth year] Referring Provider: Loreta Ave, MD  Encounter Date: 05/10/2015      PT End of Session - 05/10/15 1620    Visit Number 5   Number of Visits 8   Date for PT Re-Evaluation 05/24/15   PT Start Time 1615   PT Stop Time 1700   PT Time Calculation (min) 45 min   Activity Tolerance Patient tolerated treatment well   Behavior During Therapy [facility] for tasks assessed/performed      Past Medical History  Diagnosis Date  . Hypertension     not on BP meds currently    Past Surgical History  Procedure Laterality Date  . Breast reduction surgery    . Uterine fibroid surgery    . Cesarean section    . Hernia repair      as a baby  . Abdominal hysterectomy    . Total knee arthroplasty Left 01/06/2015    Procedure: TOTAL KNEE ARTHROPLASTY;  Surgeon: Mckinley Jewel, MD;  Location: [facility] OR;  Service: Orthopedics;  Laterality: Left;  . Total knee arthroplasty Right 03/24/2015    Procedure: TOTAL KNEE ARTHROPLASTY;  Surgeon: Loreta Ave, MD;  Location: [facility] OR;  Service: Orthopedics;  Laterality: Right;  . I&d extremity Right 03/25/2015    Procedure: FOREIGN BODY REMOVAL RIGHT KNEE;  Surgeon: Sheral Apley, MD;  Location: [facility] OR;  Service: Orthopedics;  Laterality: Right;  removal section hemovac drain tubing    There were no vitals filed for this visit.  Visit Diagnosis:  Stiffness of knee joint, right  Weakness of right leg  Abnormality of gait      Subjective Assessment - 05/10/15 1618    Subjective Patient reports she went to the gym this morning and spent ~40 minutes total between the bike and the elliptical.   Currently in Pain? No/denies            [facility] PT Assessment - 05/10/15 1615    ROM /  Strength   AROM / PROM / Strength AROM   AROM   AROM Assessment Site Knee   Right/Left Knee Right   Right Knee Extension 9   Right Knee Flexion 113   PROM   PROM Assessment Site Knee   Right/Left Knee Right   Right Knee Extension 0   Right Knee Flexion 117               [facility] Adult PT Treatment/Exercise - 05/10/15 1615    Ambulation/Gait   Gait Pattern Lateral hip instability  slight right hip drop   Stair Management Technique One rail Right;Alternating pattern;Forwards   Gait Comments Stair ascent with good reciprocal pattern, but hesitation with initial right knee flexion for eccentric lowering for which patient compensates with hip drop to initiate lowering to next step   Exercises   Exercises Knee/Hip   Knee/Hip Exercises: Aerobic   Recumbent Bike lvl 3 x 5'   Knee/Hip Exercises: Machines for Strengthening   Cybex Knee Extension DL 87# F64, 33# I95   Cybex Knee Flexion 15# x15, 20# x15   Cybex Leg Press DL 18# 8C16   Knee/Hip Exercises: Standing   Hip Flexion Right;15 reps;Knee straight   Hip Flexion Limitations Green TB   Hip ADduction Right;15 reps  Hip ADduction Limitations Green TB   Hip Abduction Right;15 reps;Knee straight   Abduction Limitations Green TB   Hip Extension Right;15 reps;Knee straight   Extension Limitations Green TB   Lateral Step Up Right;15 reps;Hand Hold: 1;Step Height: 8"   Forward Step Up Right;15 reps;Hand Hold: 1;Step Height: 8"   Step Down Right;10 reps;Hand Hold: 1;Step Height: 6"   Step Down Limitations lateral eccentric lowering with toe touch   Functional Squat 15 reps;3 seconds;2 sets   Functional Squat Limitations TRX   Other Standing Knee Exercises TM hamstring pull x10, TKE x5                PT Long Term Goals - 05/04/15 1824    PT LONG TERM GOAL #1   Title Independent with HEP (05/24/15)   Status On-going   PT LONG TERM GOAL #2   Title Right knee AROM 2-120 to allow for normal biomechanics with gait and stair  climbing (05/24/15)   Status On-going   PT LONG TERM GOAL #3   Title Demo right LE strength 4/5 or greater to improve function and stability (05/24/15)   Status On-going   PT LONG TERM GOAL #4   Title Ambulate with normal gait pattern on all surfaces (05/24/15)   Status On-going   PT LONG TERM GOAL #5   Title Ascend/descend stairs with reciprocal gait pattern (05/24/15)   Status On-going               Plan - 05/10/15 1706    Clinical Impression Statement Gait pattern nearing normal with only slight right hip drop noted with right LE advancement however continued quad weakness, especially with TKE, limiting eccentric control with stair descent. Patient will benefit from continued PT to maximize flexion ROM and active extension in TKE along with hip and knee strengthening to allow for normal gait and stair negotiation.   PT Next Visit Plan TKR protocol, emphasis on TKE   Consulted and Agree with Plan of Care Patient        Problem List Patient Active Problem List   Diagnosis Date Noted  . DJD (degenerative joint disease) of knee 01/06/2015    Marry GuanJoAnne M Kreis 05/10/2015, 5:14 PM  [facility] [address]  [address] [place], [place], [address] Phone: [phone]   Fax:  [phone]  Name: Delbert Harnessiffany Y Ferger MRN: [id number] Date of Birth: 1970-07-14

## 2015-05-12 ENCOUNTER — Ambulatory Visit: Payer: BLUE CROSS/BLUE SHIELD | Admitting: Physical Therapy

## 2015-05-12 DIAGNOSIS — R269 Unspecified abnormalities of gait and mobility: Secondary | ICD-10-CM

## 2015-05-12 DIAGNOSIS — R29898 Other symptoms and signs involving the musculoskeletal system: Secondary | ICD-10-CM

## 2015-05-12 DIAGNOSIS — M25661 Stiffness of right knee, not elsewhere classified: Secondary | ICD-10-CM

## 2015-05-12 NOTE — Therapy (Signed)
[facility] Outpatient Rehabilitation [facility] [address]  [address] [place], [place], [address] Phone: [phone]   Fax:  [phone]  Physical Therapy Treatment  Patient Details  Name: BRUCHY MIKEL MRN: [id number] Date of Birth: Aug 31, 1970 Referring Provider: Loreta Ave, MD  Encounter Date: 05/12/2015      PT End of Session - 05/12/15 1022    Visit Number 6   Number of Visits 8   Date for PT Re-Evaluation 05/24/15   PT Start Time 1018   PT Stop Time 1058   PT Time Calculation (min) 40 min   Activity Tolerance Patient tolerated treatment well   Behavior During Therapy [facility] for tasks assessed/performed      Past Medical History  Diagnosis Date  . Hypertension     not on BP meds currently    Past Surgical History  Procedure Laterality Date  . Breast reduction surgery    . Uterine fibroid surgery    . Cesarean section    . Hernia repair      as a baby  . Abdominal hysterectomy    . Total knee arthroplasty Left 01/06/2015    Procedure: TOTAL KNEE ARTHROPLASTY;  Surgeon: Mckinley Jewel, MD;  Location: [facility] OR;  Service: Orthopedics;  Laterality: Left;  . Total knee arthroplasty Right 03/24/2015    Procedure: TOTAL KNEE ARTHROPLASTY;  Surgeon: Loreta Ave, MD;  Location: [facility] OR;  Service: Orthopedics;  Laterality: Right;  . I&d extremity Right 03/25/2015    Procedure: FOREIGN BODY REMOVAL RIGHT KNEE;  Surgeon: Sheral Apley, MD;  Location: [facility] OR;  Service: Orthopedics;  Laterality: Right;  removal section hemovac drain tubing    There were no vitals filed for this visit.  Visit Diagnosis:  Stiffness of knee joint, right  Weakness of right leg  Abnormality of gait      Subjective Assessment - 05/12/15 1021    Subjective Denies pain, just stiffness.   Currently in Pain? No/denies            [facility] PT Assessment - 05/12/15 1018    ROM / Strength   AROM / PROM / Strength AROM   AROM   AROM Assessment Site Knee   Right/Left  Knee Right   Right Knee Extension 8   Right Knee Flexion 115                 [facility] Adult PT Treatment/Exercise - 05/12/15 1018    Exercises   Exercises Knee/Hip   Knee/Hip Exercises: Aerobic   Recumbent Bike lvl 3 x 5'   Knee/Hip Exercises: Machines for Strengthening   Cybex Knee Flexion 20# x15, 25# x15   Cybex Leg Press DL 66# 4Q03   Knee/Hip Exercises: Standing   Hip Flexion Right;15 reps;Knee straight   Hip Flexion Limitations Green TB   Forward Lunges Both;10 reps;3 seconds   Forward Lunges Limitations TRX - alternating   Hip ADduction Right;15 reps   Hip ADduction Limitations Green TB   Hip Abduction Right;15 reps;Knee straight   Abduction Limitations Green TB   Hip Extension Right;15 reps;Knee straight   Extension Limitations Green TB   Step Down Right;10 reps;Hand Hold: 1;Step Height: 6"   Step Down Limitations lateral eccentric lowering with toe touch   Functional Squat 15 reps;3 seconds;2 sets   Functional Squat Limitations TRX, 2nd set with heel raise on return to stand   Other Standing Knee Exercises TM hamstring pull x10, TKE x10  PT Long Term Goals - 05/12/15 1102    PT LONG TERM GOAL #1   Title Independent with HEP (05/24/15)   Status On-going   PT LONG TERM GOAL #2   Title Right knee AROM 2-120 to allow for normal biomechanics with gait and stair climbing (05/24/15)   Status On-going   PT LONG TERM GOAL #3   Title Demo right LE strength 4/5 or greater to improve function and stability (05/24/15)   Status On-going   PT LONG TERM GOAL #4   Title Ambulate with normal gait pattern on all surfaces (05/24/15)   Status On-going   PT LONG TERM GOAL #5   Title Ascend/descend stairs with reciprocal gait pattern (05/24/15)   Status On-going               Plan - 05/12/15 1104    Clinical Impression Statement Quad control improving with slowly improving active extension ROM against gravity. Tolerating exercise progression well,  therefore will plan to upgrade HEP at next visit.   PT Next Visit Plan TKR protocol, emphasis on TKE, update HEP   Consulted and Agree with Plan of Care Patient        Problem List Patient Active Problem List   Diagnosis Date Noted  . DJD (degenerative joint disease) of knee 01/06/2015    Marry GuanJoAnne M Gazella Anglin, PT, MPT 05/12/2015, 11:14 AM  [facility] [address]  [address] [place], [place], [address] Phone: [phone]   Fax:  [phone]  Name: Delbert Harnessiffany Y Couper MRN: [id number] Date of Birth: June 24, 1970

## 2015-05-17 ENCOUNTER — Ambulatory Visit: Payer: BLUE CROSS/BLUE SHIELD | Admitting: Physical Therapy

## 2015-05-17 DIAGNOSIS — M25661 Stiffness of right knee, not elsewhere classified: Secondary | ICD-10-CM | POA: Diagnosis not present

## 2015-05-17 DIAGNOSIS — R29898 Other symptoms and signs involving the musculoskeletal system: Secondary | ICD-10-CM

## 2015-05-17 DIAGNOSIS — M7989 Other specified soft tissue disorders: Secondary | ICD-10-CM

## 2015-05-17 DIAGNOSIS — R269 Unspecified abnormalities of gait and mobility: Secondary | ICD-10-CM

## 2015-05-17 NOTE — Therapy (Signed)
[facility] [address]  [address] [place], [place], [address] Phone: [phone]   Fax:  [phone]  Physical Therapy Treatment  Patient Details  Name: Katherine Harnessiffany Y Gilmore MRN: [id number] Date of Birth: 1970/09/10 Referring Provider: Loreta Aveaniel F Murphy, MD  Encounter Date: 05/17/2015      PT End of Session - 05/17/15 1624    Visit Number 7   Number of Visits 8   Date for PT Re-Evaluation 05/24/15   PT Start Time 1620   PT Stop Time 1714   PT Time Calculation (min) 54 min   Activity Tolerance Patient tolerated treatment well   Behavior [facility] Therapy [facility] for tasks assessed/performed      Past Medical History  Diagnosis Date  . Hypertension     not on BP meds currently    Past Surgical History  Procedure Laterality Date  . Breast reduction surgery    . Uterine fibroid surgery    . Cesarean section    . Hernia repair      as a baby  . Abdominal hysterectomy    . Total knee arthroplasty Left 01/06/2015    Procedure: TOTAL KNEE ARTHROPLASTY;  Surgeon: Mckinley Jewelaniel Murphy, MD;  [facility];  Service: Orthopedics;  Laterality: Left;  . Total knee arthroplasty Right 03/24/2015    Procedure: TOTAL KNEE ARTHROPLASTY;  Surgeon: Loreta Aveaniel F Murphy, MD;  [facility];  Service: Orthopedics;  Laterality: Right;  . I&d extremity Right 03/25/2015    Procedure: FOREIGN BODY REMOVAL RIGHT KNEE;  Surgeon: Sheral Apleyimothy D Murphy, MD;  Location: [facility] OR;  Service: Orthopedics;  Laterality: Right;  removal section hemovac drain tubing    There were no vitals filed for this visit.  Visit Diagnosis:  Stiffness of knee joint, right  Weakness of right leg  Swelling of limb  Abnormality of gait      Subjective Assessment - 05/17/15 1623    Subjective Reports some pain earlier today, but thinks it may have been from sitting too long. Just weak and stiff but no pain at present. States traveled over weekend and did not ice as often as she  normally does.   Currently in Pain? No/denies            [facility] - 05/17/15 1620    ROM / Strength   AROM / PROM / Strength AROM   AROM   AROM Assessment Site Knee   Right/Left Knee Right   Right Knee Extension 9   Right Knee Flexion 111                     [facility] Adult PT Treatment/Exercise - 05/17/15 1620    Exercises   Exercises Knee/Hip   Knee/Hip Exercises: Stretches   Knee: Self-Stretch to increase Flexion Right;10 seconds;5 reps   Knee: Self-Stretch Limitations Step stretch on BATCA seat at lowest position   Knee/Hip Exercises: Aerobic   Nustep lvl 5 x 5'   Knee/Hip Exercises: Standing   Lateral Step Up Right;15 reps;Hand Hold: 1;Step Height: 8"   Forward Step Up Right;15 reps;Hand Hold: 1;Step Height: 8"   Forward Step Up Limitations TKE with black TB at top of step   Step Down Right;15 reps;Hand Hold: 1;Step Height: 6"   Step Down Limitations lateral eccentric lowering with heel touch   Functional Squat 15 reps;3 seconds;2 sets   Functional Squat Limitations TRX, with heel raise on return to stand   Knee/Hip Exercises: Supine  Knee Flexion AROM;Both;15 reps   Knee Flexion Limitations peanut ball tuck   Modalities   Modalities Vasopneumatic   Vasopneumatic   Number Minutes Vasopneumatic  15 minutes   Vasopnuematic Location  Knee   Vasopneumatic Pressure Medium   Vasopneumatic Temperature  Lowest   Manual Therapy   Manual Therapy Joint mobilization;Passive ROM   Manual therapy comments Patellar mobs all directions   Joint Mobilization Supine P/A mobs for increased flexion   Passive ROM Supine flexion overpressure                     PT Long Term Goals - 05/12/15 1102    PT LONG TERM GOAL #1   Title Independent with HEP (05/24/15)   Status On-going   PT LONG TERM GOAL #2   Title Right knee AROM 2-120 to allow for normal biomechanics with gait and stair climbing (05/24/15)   Status On-going   PT LONG TERM GOAL #3    Title Demo right LE strength 4/5 or greater to improve function and stability (05/24/15)   Status On-going   PT LONG TERM GOAL #4   Title Ambulate with normal gait pattern on all surfaces (05/24/15)   Status On-going   PT LONG TERM GOAL #5   Title Ascend/descend stairs with reciprocal gait pattern (05/24/15)   Status On-going               Plan - 05/17/15 1820    Clinical Impression Statement Increased edema and warmth present in right knee today but no redness or signs of infection and localized to the anterior knee. Educated patient to notify MD if symptoms persist, spread or accompanied by redness or signs of infection. Increased edema resulting in limited flexion ROM and perceived increased weakness by patient.   PT Next Visit Plan Recert; TKR protocol, emphasis on TKE, update HEP as appropriate   Consulted and Agree with Plan of Care Patient        Problem List Patient Active Problem List   Diagnosis Date Noted  . DJD (degenerative joint disease) of knee 01/06/2015    Marry Guan, PT, MPT 05/17/2015, 6:27 PM  [facility] [address]  [address] [place], [place], [address] Phone: [phone]   Fax:  [phone]  Name: Katherine Gilmore MRN: [id number] Date of Birth: 01/19/1971

## 2015-05-20 ENCOUNTER — Ambulatory Visit: Payer: BLUE CROSS/BLUE SHIELD | Attending: Orthopedic Surgery | Admitting: Physical Therapy

## 2015-05-20 DIAGNOSIS — M7989 Other specified soft tissue disorders: Secondary | ICD-10-CM

## 2015-05-20 DIAGNOSIS — M25561 Pain in right knee: Secondary | ICD-10-CM | POA: Insufficient documentation

## 2015-05-20 DIAGNOSIS — M25661 Stiffness of right knee, not elsewhere classified: Secondary | ICD-10-CM | POA: Diagnosis present

## 2015-05-20 DIAGNOSIS — M25562 Pain in left knee: Secondary | ICD-10-CM | POA: Insufficient documentation

## 2015-05-20 DIAGNOSIS — R269 Unspecified abnormalities of gait and mobility: Secondary | ICD-10-CM | POA: Diagnosis present

## 2015-05-20 DIAGNOSIS — R29898 Other symptoms and signs involving the musculoskeletal system: Secondary | ICD-10-CM | POA: Diagnosis present

## 2015-05-20 NOTE — Therapy (Signed)
[name] [address]  [place], [place], [address] Phone: [phone]   Fax:  [phone]  Physical Therapy Treatment  Patient Details  Name: Katherine Gilmore MRN: [id number] Date of Birth: 1970/08/21 Referring Provider: Ninetta Lights, MD  Encounter Date: 05/20/2015      PT End of Session - 05/20/15 1110    Visit Number 8   Number of Visits 16   Date for PT Re-Evaluation 06/17/15   PT Start Time 1104   PT Stop Time 1205   PT Time Calculation (min) 61 min   Activity Tolerance Patient tolerated treatment well   Behavior During Therapy [facility] for tasks assessed/performed      Past Medical History  Diagnosis Date  . Hypertension     not on BP meds currently    Past Surgical History  Procedure Laterality Date  . Breast reduction surgery    . Uterine fibroid surgery    . Cesarean section    . Hernia repair      as a baby  . Abdominal hysterectomy    . Total knee arthroplasty Left 01/06/2015    Procedure: TOTAL KNEE ARTHROPLASTY;  Surgeon: Kathryne Hitch, MD;  Location: [place];  Service: Orthopedics;  Laterality: Left;  . Total knee arthroplasty Right 03/24/2015    Procedure: TOTAL KNEE ARTHROPLASTY;  Surgeon: Ninetta Lights, MD;  Location: [place];  Service: Orthopedics;  Laterality: Right;  . I&d extremity Right 03/25/2015    Procedure: FOREIGN BODY REMOVAL RIGHT KNEE;  Surgeon: Renette Butters, MD;  Location: [place];  Service: Orthopedics;  Laterality: Right;  removal section hemovac drain tubing    There were no vitals filed for this visit.  Visit Diagnosis:  Stiffness of knee joint, right  Weakness of right leg  Swelling of limb  Abnormality of gait      Subjective Assessment - 05/20/15 1108    Subjective Patient stating that this has been a bad week with more pain and stiffness.   Currently in Pain? Yes   Pain Score 2    Pain Location Knee   Pain Orientation Right            [facility] PT  Assessment - 05/20/15 1104    Assessment   Medical Diagnosis Right TKR   Onset Date/Surgical Date 03/24/15   Next MD Visit 05/25/15   ROM / Strength   AROM / PROM / Strength AROM;PROM;Strength   AROM   AROM Assessment Site Knee   Right/Left Knee Right   Right Knee Extension 5   Right Knee Flexion 112   PROM   PROM Assessment Site Knee   Right/Left Knee Right   Right Knee Extension 0   Right Knee Flexion 115   Strength   Strength Assessment Site Hip;Knee   Right/Left Hip Right   Right Hip Flexion 4-/5   Right Hip Extension 4-/5   Right Hip ABduction 4-/5   Right Hip ADduction 4-/5   Right/Left Knee Right   Right Knee Flexion 4/5   Right Knee Extension 4/5   Ambulation/Gait   Stairs Assistance 6: Modified independent (Device/Increase time)   Stair Management Technique One rail Right;Alternating pattern;Forwards   Gait Comments Stair ascent with good reciprocal pattern without UE support although increased trunk flexion. Continued hip instability creating increased lateral trunk excursion and hip drop during descent.                 [facility] Adult  PT Treatment/Exercise - 05/20/15 1104    Exercises   Exercises Knee/Hip   Knee/Hip Exercises: Stretches   Knee: Self-Stretch to increase Flexion Right;10 seconds;5 reps   Knee: Self-Stretch Limitations Step stretch on BATCA seat at lowest position   Knee/Hip Exercises: Aerobic   Recumbent Bike interval L3-5 x 5'   Knee/Hip Exercises: Standing   Hip Flexion Right;20 reps;Knee straight   Hip Flexion Limitations Green TB   Hip ADduction Right;20 reps   Hip ADduction Limitations Green TB   Hip Abduction Right;20 reps;Knee straight   Abduction Limitations Green TB   Hip Extension Right;20 reps;Knee straight   Extension Limitations Green TB   Lateral Step Up Right;Step Height: 8";20 reps;Hand Hold: 0   Forward Step Up Right;20 reps;Step Height: 8";Hand Hold: 0   Forward Step Up Limitations TKE with black TB at top of step    Step Down Right;Hand Hold: 1;Step Height: 6";20 reps   Step Down Limitations lateral eccentric lowering with heel touch   Functional Squat 15 reps;3 seconds;2 sets   Functional Squat Limitations TRX, with heel raise on return to stand   Modalities   Modalities Vasopneumatic   Vasopneumatic   Number Minutes Vasopneumatic  15 minutes   Vasopnuematic Location  Knee   Vasopneumatic Pressure Medium   Vasopneumatic Temperature  Lowest   Manual Therapy   Manual Therapy Joint mobilization;Passive ROM   Manual therapy comments Patellar mobs all directions   Joint Mobilization Supine P/A mobs for increased flexion   Passive ROM Supine and prone flexion overpressure                PT Education - 05/20/15 1221    Education provided Yes   Education Details HEP update - hip strengthening   Person(s) Educated Patient   Methods Explanation;Demonstration;Handout   Comprehension Verbalized understanding;Returned demonstration             PT Long Term Goals - 05/20/15 1144    PT LONG TERM GOAL #1   Title Independent with HEP (06/17/15)   Status On-going   PT LONG TERM GOAL #2   Title Right knee AROM 2-120 to allow for normal biomechanics with gait and stair climbing (06/17/15)   Status On-going   PT LONG TERM GOAL #3   Title Demo right LE strength 4/5 or greater to improve function and stability (06/17/15)   Status Partially Met  Knee 4/5, but hip only 4-/5   PT LONG TERM GOAL #4   Title Ambulate with normal gait pattern on all surfaces (05/24/15)   Status Achieved   PT LONG TERM GOAL #5   Title Ascend/descend stairs with reciprocal gait pattern (06/17/15)   Status On-going               Plan - 05/20/15 1222    Clinical Impression Statement Patient demonstrating progress with PT with improved gait pattern and right knee strength 4/5 but remains weaker in hip creating increased instability and difficulty with stairs, especially descent. Right knee ROM 5-112 AROM and  0-115 PROM with quad weakness in TKE. Patient has met gait goal, but remaining goals not met or only partially met. Patient continues to have potential to benefit from continued skilled PT 2x/wk to address remaining deficits and restore normal movement patterns especially with stair negotiation.   Pt will benefit from skilled therapeutic intervention in order to improve on the following deficits Decreased range of motion;Impaired flexibility;Pain;Decreased strength;Difficulty walking;Abnormal gait;Increased edema;Decreased scar mobility;Decreased activity tolerance;Improper body mechanics  PT Frequency 2x / week   PT Duration --  up to 4 additional weeks   PT Treatment/Interventions Therapeutic exercise;Manual techniques;Passive range of motion;Therapeutic activities;Gait training;Stair training;Electrical Stimulation;Cryotherapy;Vasopneumatic Device;Taping;Balance training;Neuromuscular re-education;Patient/family education   PT Next Visit Plan TKR protocol, emphasis on TKE and hip strengthening, update HEP as appropriate   Consulted and Agree with Plan of Care Patient        Problem List Patient Active Problem List   Diagnosis Date Noted  . DJD (degenerative joint disease) of knee 01/06/2015    Percival Spanish, PT, MPT 05/20/2015, 12:34 PM  [facility] [address]  [address] [place], [place], [address] Phone: [phone]   Fax:  [phone]  Name: DARTHY MANGANELLI MRN: [id number] Date of Birth: 08/08/1970

## 2015-05-24 ENCOUNTER — Ambulatory Visit: Payer: BLUE CROSS/BLUE SHIELD | Admitting: Physical Therapy

## 2015-05-24 DIAGNOSIS — M25661 Stiffness of right knee, not elsewhere classified: Secondary | ICD-10-CM

## 2015-05-24 DIAGNOSIS — R269 Unspecified abnormalities of gait and mobility: Secondary | ICD-10-CM

## 2015-05-24 DIAGNOSIS — M25561 Pain in right knee: Secondary | ICD-10-CM

## 2015-05-24 DIAGNOSIS — R29898 Other symptoms and signs involving the musculoskeletal system: Secondary | ICD-10-CM

## 2015-05-24 DIAGNOSIS — M7989 Other specified soft tissue disorders: Secondary | ICD-10-CM

## 2015-05-24 NOTE — Therapy (Signed)
[facility] [address]  [place], [place], [address] Phone: [phone]   Fax:  [phone]  Physical Therapy Treatment  Patient Details  Name: Katherine Gilmore MRN: [id number] Date of Birth: January 15, 1971 Referring Provider: Ninetta Lights, MD  Encounter Date: 05/24/2015      PT End of Session - 05/24/15 1624    Visit Number 9   Number of Visits 16   Date for PT Re-Evaluation 06/17/15   PT Start Time 1618   PT Stop Time 1658   PT Time Calculation (min) 40 min   Activity Tolerance Patient tolerated treatment well   Behavior During Therapy [facility] for tasks assessed/performed      Past Medical History  Diagnosis Date  . Hypertension     not on BP meds currently    Past Surgical History  Procedure Laterality Date  . Breast reduction surgery    . Uterine fibroid surgery    . Cesarean section    . Hernia repair      as a baby  . Abdominal hysterectomy    . Total knee arthroplasty Left 01/06/2015    Procedure: TOTAL KNEE ARTHROPLASTY;  Surgeon: Kathryne Hitch, MD;  Location: [place];  Service: Orthopedics;  Laterality: Left;  . Total knee arthroplasty Right 03/24/2015    Procedure: TOTAL KNEE ARTHROPLASTY;  Surgeon: Ninetta Lights, MD;  Location: [place];  Service: Orthopedics;  Laterality: Right;  . I&d extremity Right 03/25/2015    Procedure: FOREIGN BODY REMOVAL RIGHT KNEE;  Surgeon: Renette Butters, MD;  Location: [place];  Service: Orthopedics;  Laterality: Right;  removal section hemovac drain tubing    There were no vitals filed for this visit.  Visit Diagnosis:  Stiffness of knee joint, right  Weakness of right leg  Right knee pain  Swelling of limb  Abnormality of gait      Subjective Assessment - 05/24/15 1622    Subjective Patient states knee essentially unchanged.   Currently in Pain? Yes   Pain Score 2    Pain Location Knee   Pain Orientation Right   Pain Descriptors / Indicators  Sore;Aching            [facility] PT Assessment - 05/24/15 1618    Assessment   Next MD Visit 06/01/15   ROM / Strength   AROM / PROM / Strength AROM   AROM   AROM Assessment Site Knee   Right/Left Knee Right   Right Knee Extension 5   Right Knee Flexion 117   PROM   PROM Assessment Site Knee   Right/Left Knee Right   Right Knee Extension 0                     [facility] Adult PT Treatment/Exercise - 05/24/15 1618    Exercises   Exercises Knee/Hip   Knee/Hip Exercises: Stretches   Knee: Self-Stretch to increase Flexion Right;10 seconds;5 reps   Knee: Self-Stretch Limitations Step stretch on BATCA seat at lowest position   Knee/Hip Exercises: Aerobic   Nustep lvl 5 x 6'   Knee/Hip Exercises: Standing   Terminal Knee Extension Right;20 reps;Theraband   Theraband Level (Terminal Knee Extension) --  Black   Lateral Step Up Right;20 reps;Hand Hold: 1   Lateral Step Up Limitations BOSU   Forward Step Up Right;10 reps;2 sets;Hand Hold: 2   Forward Step Up Limitations BOSU (up), 2nd set + TKE with black TB at top  of step   Functional Squat 15 reps;3 seconds;2 sets   Functional Squat Limitations TRX, with heel raise on return to stand   Walking with Sports Cord Side-stepping in slight crouch with blue TB 28f x2, bilateral; Monster walk fwd/back with blue TB x 254f                    PT Long Term Goals - 05/20/15 1144    PT LONG TERM GOAL #1   Title Independent with HEP (06/17/15)   Status On-going   PT LONG TERM GOAL #2   Title Right knee AROM 2-120 to allow for normal biomechanics with gait and stair climbing (06/17/15)   Status On-going   PT LONG TERM GOAL #3   Title Demo right LE strength 4/5 or greater to improve function and stability (06/17/15)   Status Partially Met  Knee 4/5, but hip only 4-/5   PT LONG TERM GOAL #4   Title Ambulate with normal gait pattern on all surfaces (05/24/15)   Status Achieved   PT LONG TERM GOAL #5   Title  Ascend/descend stairs with reciprocal gait pattern (06/17/15)   Status On-going               Plan - 05/24/15 1713    Clinical Impression Statement Continued emphasis on hip strengthening and dynamic stability with increased fatigue noted today. Patient deferred vaso today secondary needing to leave to pick up her son, but will ice at home.   PT Next Visit Plan TKR protocol, emphasis on TKE and hip strengthening, update HEP as appropriate   Consulted and Agree with Plan of Care Patient        Problem List Patient Active Problem List   Diagnosis Date Noted  . DJD (degenerative joint disease) of knee 01/06/2015    JoPercival SpanishPT, MPT 05/24/2015, 5:23 PM  [facility]: [phone] Fax:  [phone]: Katherine AGUINAGARN: [id number] of Birth: 1/04-21-1972

## 2015-05-27 ENCOUNTER — Ambulatory Visit: Payer: BLUE CROSS/BLUE SHIELD | Admitting: Physical Therapy

## 2015-05-27 DIAGNOSIS — M25561 Pain in right knee: Secondary | ICD-10-CM

## 2015-05-27 DIAGNOSIS — M7989 Other specified soft tissue disorders: Secondary | ICD-10-CM

## 2015-05-27 DIAGNOSIS — M25661 Stiffness of right knee, not elsewhere classified: Secondary | ICD-10-CM

## 2015-05-27 DIAGNOSIS — R269 Unspecified abnormalities of gait and mobility: Secondary | ICD-10-CM

## 2015-05-27 DIAGNOSIS — R29898 Other symptoms and signs involving the musculoskeletal system: Secondary | ICD-10-CM

## 2015-05-27 NOTE — Therapy (Signed)
[facility] [address]  [place], [place], [address] Phone: [phone]   Fax:  [phone]  Physical Therapy Treatment  Patient Details  Name: Katherine Gilmore MRN: [id number] Date of Birth: September 23, 1970 Referring Provider: Ninetta Lights, MD  Encounter Date: 05/27/2015      PT End of Session - 05/27/15 1620    Visit Number 10   Number of Visits 16   Date for PT Re-Evaluation 06/17/15   PT Start Time 2229   PT Stop Time 1713   PT Time Calculation (min) 58 min   Activity Tolerance Patient tolerated treatment well   Behavior During Therapy [facility] for tasks assessed/performed      Past Medical History  Diagnosis Date  . Hypertension     not on BP meds currently    Past Surgical History  Procedure Laterality Date  . Breast reduction surgery    . Uterine fibroid surgery    . Cesarean section    . Hernia repair      as a baby  . Abdominal hysterectomy    . Total knee arthroplasty Left 01/06/2015    Procedure: TOTAL KNEE ARTHROPLASTY;  Surgeon: Kathryne Hitch, MD;  Location: [place];  Service: Orthopedics;  Laterality: Left;  . Total knee arthroplasty Right 03/24/2015    Procedure: TOTAL KNEE ARTHROPLASTY;  Surgeon: Ninetta Lights, MD;  Location: [place];  Service: Orthopedics;  Laterality: Right;  . I&d extremity Right 03/25/2015    Procedure: FOREIGN BODY REMOVAL RIGHT KNEE;  Surgeon: Renette Butters, MD;  Location: [place];  Service: Orthopedics;  Laterality: Right;  removal section hemovac drain tubing    There were no vitals filed for this visit.  Visit Diagnosis:  Stiffness of knee joint, right  Weakness of right leg  Right knee pain  Swelling of limb  Abnormality of gait      Subjective Assessment - 05/27/15 1615    Subjective Patient reports knee more sore today so "took some pain meds so I can get it to bend". Reports some days better than others but not consistent yet.   Currently in Pain? Yes    Pain Score 4    Pain Location Knee   Pain Orientation Right   Pain Descriptors / Indicators Sore            [facility] PT Assessment - 05/27/15 1615    Observation/Other Assessments   Focus on Therapeutic Outcomes (FOTO)  Knee 56% (44% limitation)   ROM / Strength   AROM / PROM / Strength AROM   AROM   AROM Assessment Site Knee   Right/Left Knee Right   Right Knee Extension 5   Right Knee Flexion 118   PROM   PROM Assessment Site Knee   Right/Left Knee Right   Right Knee Extension 0   Right Knee Flexion 120                     [facility] Adult PT Treatment/Exercise - 05/27/15 1615    Exercises   Exercises Knee/Hip   Knee/Hip Exercises: Stretches   Knee: Self-Stretch to increase Flexion Right;10 seconds;5 reps   Knee: Self-Stretch Limitations Step stretch on BATCA seat at 3rd lowest position   Knee/Hip Exercises: Aerobic   Recumbent Bike interval L3-5 x 5'   Knee/Hip Exercises: Machines for Strengthening   Cybex Knee Extension DL 20# x15, 25# x15 - B concentric, R eccentric   Cybex Knee Flexion  20# x15, 25# x15 - B concentric, R eccentric   Cybex Leg Press DL 35# 2x15   Knee/Hip Exercises: Standing   Lateral Step Up Right;20 reps;Hand Hold: 1   Lateral Step Up Limitations BOSU   Forward Step Up Right;10 reps;2 sets;Hand Hold: 1   Forward Step Up Limitations BOSU (up) + TKE with black TB at top of step   Step Down Right;20 reps;Hand Hold: 1;Step Height: 8"   Step Down Limitations lateral eccentric lowering with toe touch   Modalities   Modalities Vasopneumatic   Vasopneumatic   Number Minutes Vasopneumatic  15 minutes   Vasopnuematic Location  Knee   Vasopneumatic Pressure Medium   Vasopneumatic Temperature  Lowest   Manual Therapy   Manual Therapy Joint mobilization;Passive ROM   Manual therapy comments Patellar mobs all directions   Joint Mobilization Supine P/A mobs for increased flexion   Passive ROM Supine flexion overpressure                      PT Long Term Goals - 05/27/15 1731    PT LONG TERM GOAL #1   Title Independent with HEP (06/17/15)   Status On-going   PT LONG TERM GOAL #2   Title Right knee AROM 2-120 to allow for normal biomechanics with gait and stair climbing (06/17/15)   Status On-going   PT LONG TERM GOAL #3   Title Demo right LE strength 4/5 or greater to improve function and stability (06/17/15)   Status Partially Met   PT LONG TERM GOAL #4   Title Ambulate with normal gait pattern on all surfaces (05/24/15)   Status Achieved   PT LONG TERM GOAL #5   Title Ascend/descend stairs with reciprocal gait pattern (06/17/15)   Status On-going               Plan - 05/27/15 1810    Clinical Impression Statement Right knee flexion ROM reached 120 degrees passively with 118 actively but still lacking 5 degrees TKE. Excellent incisional scar mobility noted. Patient reporting continued functional limitation most pronounced with stair descent.   PT Next Visit Plan TKR protocol, emphasis on TKE and hip strengthening, update HEP as appropriate, MD note (06/01/15)   Consulted and Agree with Plan of Care Patient        Problem List Patient Active Problem List   Diagnosis Date Noted  . DJD (degenerative joint disease) of knee 01/06/2015    Percival Spanish, PT, MPT 05/27/2015, 6:16 PM  [facility] [address]  [address] [place], [place], [address] Phone: [phone]   Fax:  [phone]  Name: Katherine Gilmore MRN: [id number] Date of Birth: 1971-03-26

## 2015-05-31 ENCOUNTER — Encounter: Payer: Self-pay | Admitting: Rehabilitation

## 2015-05-31 ENCOUNTER — Ambulatory Visit: Payer: BLUE CROSS/BLUE SHIELD | Admitting: Rehabilitation

## 2015-05-31 DIAGNOSIS — M25661 Stiffness of right knee, not elsewhere classified: Secondary | ICD-10-CM

## 2015-05-31 DIAGNOSIS — R269 Unspecified abnormalities of gait and mobility: Secondary | ICD-10-CM

## 2015-05-31 DIAGNOSIS — R29898 Other symptoms and signs involving the musculoskeletal system: Secondary | ICD-10-CM

## 2015-05-31 DIAGNOSIS — M25561 Pain in right knee: Secondary | ICD-10-CM

## 2015-05-31 DIAGNOSIS — M25562 Pain in left knee: Secondary | ICD-10-CM

## 2015-05-31 NOTE — Therapy (Signed)
[facility] [address]  [place], [place], [address] Phone: [phone]   Fax:  [phone]  Physical Therapy Treatment  Patient Details  Name: Katherine Gilmore MRN: [id number] Date of Birth: 1970/08/14 Referring Provider: Ninetta Lights, MD  Encounter Date: 05/31/2015      PT End of Session - 05/31/15 1514    Visit Number 11   Number of Visits 16   Date for PT Re-Evaluation 06/17/15   PT Start Time 1450   PT Stop Time 1530   PT Time Calculation (min) 40 min   Activity Tolerance Patient tolerated treatment well      Past Medical History  Diagnosis Date  . Hypertension     not on BP meds currently    Past Surgical History  Procedure Laterality Date  . Breast reduction surgery    . Uterine fibroid surgery    . Cesarean section    . Hernia repair      as a baby  . Abdominal hysterectomy    . Total knee arthroplasty Left 01/06/2015    Procedure: TOTAL KNEE ARTHROPLASTY;  Surgeon: Kathryne Hitch, MD;  Location: [place];  Service: Orthopedics;  Laterality: Left;  . Total knee arthroplasty Right 03/24/2015    Procedure: TOTAL KNEE ARTHROPLASTY;  Surgeon: Ninetta Lights, MD;  Location: [place];  Service: Orthopedics;  Laterality: Right;  . I&d extremity Right 03/25/2015    Procedure: FOREIGN BODY REMOVAL RIGHT KNEE;  Surgeon: Renette Butters, MD;  Location: [place];  Service: Orthopedics;  Laterality: Right;  removal section hemovac drain tubing    There were no vitals filed for this visit.  Visit Diagnosis:  Stiffness of knee joint, right  Weakness of right leg  Right knee pain  Abnormality of gait  Left knee pain  Weakness of left leg      Subjective Assessment - 05/31/15 1450    Subjective nothing new to report; " I hope he says I am done with therapy"    Pertinent History Right TKR 03/24/15, Left TKR 01/06/15   Currently in Pain? No/denies  took pain medication 30 minutes ago   Aggravating  Factors  none identified   Pain Relieving Factors ice, pain medication            [facility] PT Assessment - 05/31/15 0001    Observation/Other Assessments   Focus on Therapeutic Outcomes (FOTO)  Knee 56% (44% limitation)   AROM   AROM Assessment Site Knee   Right/Left Knee Right   Right Knee Extension 5   Right Knee Flexion 118   PROM   PROM Assessment Site Knee   Right/Left Knee Right   Right Knee Extension 0   Right Knee Flexion 120   Flexibility   Hamstrings mildly tighter on left vs right                     [facility] Adult PT Treatment/Exercise - 05/31/15 0001    Knee/Hip Exercises: Aerobic   Recumbent Bike interval L3-5 x 5'   Knee/Hip Exercises: Machines for Strengthening   Cybex Knee Extension DL 25# x15 - B concentric, R eccentric x15   Cybex Knee Flexion 25# x15 - B concentric, R eccentric   Cybex Leg Press DL 35# 2x15   Knee/Hip Exercises: Standing   Lateral Step Up Right;20 reps;Hand Hold: 1   Lateral Step Up Limitations BOSU   Forward Step Up Right;10 reps;2 sets;Hand Hold: 1  Forward Step Up Limitations bosu    Step Down Right;20 reps;Hand Hold: 1;Step Height: 8"   Step Down Limitations lateral eccentric lowering with toe touch   Manual Therapy   Passive ROM PROM into flexion and extension to tolerance; PA pressure assist into extension                     PT Long Term Goals - 05/31/15 1515    PT LONG TERM GOAL #1   Title Independent with HEP (06/17/15)   Status On-going   PT LONG TERM GOAL #2   Title Right knee AROM 2-120 to allow for normal biomechanics with gait and stair climbing (06/17/15)   Status On-going   PT LONG TERM GOAL #3   Title Demo right LE strength 4/5 or greater to improve function and stability (06/17/15)   Status Partially Met   PT LONG TERM GOAL #4   Title Ambulate with normal gait pattern on all surfaces (05/24/15)   Status Achieved   PT LONG TERM GOAL #5   Title Ascend/descend stairs with reciprocal gait  pattern (06/17/15)   Status On-going               Plan - 05/31/15 1515    Clinical Impression Statement Overall patient reporting functional limitations with stair descent but everything else not limited.  Pt feels like she is "almost there" in terms of strength and ROM and would like to continue PT until the end of the year.  Continues to lack 5 degrees of extension with flexion around 118 lately.     Pt will benefit from skilled therapeutic intervention in order to improve on the following deficits Decreased range of motion;Impaired flexibility;Pain;Decreased strength;Difficulty walking;Abnormal gait;Increased edema;Decreased scar mobility;Decreased activity tolerance;Improper body mechanics   PT Frequency 2x / week   PT Treatment/Interventions Therapeutic exercise;Manual techniques;Passive range of motion;Therapeutic activities;Gait training;Stair training;Electrical Stimulation;Cryotherapy;Vasopneumatic Device;Taping;Balance training;Neuromuscular re-education;Patient/family education   PT Next Visit Plan TKR protocol, emphasis on TKE and hip strengthening, update HEP as appropriate   Consulted and Agree with Plan of Care Patient        Problem List Patient Active Problem List   Diagnosis Date Noted  . DJD (degenerative joint disease) of knee 01/06/2015    Stark Bray, DPT, CMP 05/31/2015, 3:30 PM  [facility] [address]  [address] [place], [place], [address] Phone: [phone]   Fax:  [phone]  Name: SRIHITHA TAGLIAFERRI MRN: [id number] Date of Birth: 01-11-1971

## 2015-06-03 ENCOUNTER — Ambulatory Visit: Payer: BLUE CROSS/BLUE SHIELD | Admitting: Physical Therapy

## 2015-06-03 DIAGNOSIS — M25661 Stiffness of right knee, not elsewhere classified: Secondary | ICD-10-CM

## 2015-06-03 DIAGNOSIS — M25561 Pain in right knee: Secondary | ICD-10-CM

## 2015-06-03 DIAGNOSIS — R29898 Other symptoms and signs involving the musculoskeletal system: Secondary | ICD-10-CM

## 2015-06-03 DIAGNOSIS — R269 Unspecified abnormalities of gait and mobility: Secondary | ICD-10-CM

## 2015-06-03 NOTE — Therapy (Signed)
[facility] [address]  [place], [place], [address] Phone: [phone]   Fax:  [phone]  Physical Therapy Treatment  Patient Details  Name: Katherine Gilmore MRN: [id number] Date of Birth: 1970-11-04 Referring Provider: Ninetta Lights, MD  Encounter Date: 06/03/2015      PT End of Session - 06/03/15 0932    Visit Number 12   Number of Visits 16   Date for PT Re-Evaluation 06/17/15   PT Start Time 0932   PT Stop Time 1020   PT Time Calculation (min) 48 min      Past Medical History  Diagnosis Date  . Hypertension     not on BP meds currently    Past Surgical History  Procedure Laterality Date  . Breast reduction surgery    . Uterine fibroid surgery    . Cesarean section    . Hernia repair      as a baby  . Abdominal hysterectomy    . Total knee arthroplasty Left 01/06/2015    Procedure: TOTAL KNEE ARTHROPLASTY;  Surgeon: Kathryne Hitch, MD;  Location: [facility];  Service: Orthopedics;  Laterality: Left;  . Total knee arthroplasty Right 03/24/2015    Procedure: TOTAL KNEE ARTHROPLASTY;  Surgeon: Ninetta Lights, MD;  Location: [facility];  Service: Orthopedics;  Laterality: Right;  . I&d extremity Right 03/25/2015    Procedure: FOREIGN BODY REMOVAL RIGHT KNEE;  Surgeon: Renette Butters, MD;  Location: [facility];  Service: Orthopedics;  Laterality: Right;  removal section hemovac drain tubing    There were no vitals filed for this visit.  Visit Diagnosis:  Stiffness of knee joint, right  Weakness of right leg  Right knee pain  Abnormality of gait      Subjective Assessment - 06/03/15 0940    Subjective states is progressing well and MD pleased overall.  She is interested in performing exercises at gym independently rather than attend PT.  Discussed this with MD and he advised her to make sure she she has PT instruct her proper "motion".   Currently in Pain? --  no pain just stiffness             [facility] PT Assessment - 06/03/15 0001    AROM   Right Knee Extension 5   PROM   Right Knee Extension 0   Strength   Right Hip Flexion 4/5   Right Hip Extension 4/5   Right Hip ABduction 4+/5   Right Hip ADduction 5/5   Right Knee Flexion 4+/5   Right Knee Extension 4+/5     TODAY'S TREATMENT TherEx - Rec Bike lvl 2-3 5' Gait in halls and up/down stairs (see assessment) R Hip and Knee MMT assessment Prone Knee Flexion Stretch Leg Press 20# R 15x with focus on TKE Standing TKE Black TB 15x3" B Knee Extension Machine 25# 20x B Knee Flexion Machine 35# 20x Bridge 10x Bridge with March 10x            PT Education - 06/03/15 1023    Education provided Yes   Education Details HEP update   Person(s) Educated Patient   Methods Explanation;Demonstration;Handout   Comprehension Verbalized understanding;Returned demonstration             PT Long Term Goals - 05/31/15 1515    PT LONG TERM GOAL #1   Title Independent with HEP (06/17/15)   Status On-going   PT LONG TERM GOAL #2  Title Right knee AROM 2-120 to allow for normal biomechanics with gait and stair climbing (06/17/15)   Status On-going   PT LONG TERM GOAL #3   Title Demo right LE strength 4/5 or greater to improve function and stability (06/17/15)   Status Partially Met   PT LONG TERM GOAL #4   Title Ambulate with normal gait pattern on all surfaces (05/24/15)   Status Achieved   PT LONG TERM GOAL #5   Title Ascend/descend stairs with reciprocal gait pattern (06/17/15)   Status On-going               Plan - 06/03/15 1029    Clinical Impression Statement pt with excellent progress with regard to R knee ROM, Strength, and current level of function.  She continues to display slight TKE LOM (5 degrees) and difficulty with eccentric control R quads with descending stairs (requires light rail use and displays mild pelvic twist to R when lowering L LE but pt is aware of these issues and is working to  address at home with HEP).  Pt states she is going to return to working out at gym and feels as though she can progress knee independently going forward.  We reviewed mechanicans and exercise ideas with gym-based workout but also updated HEP to address TKE and progress hip stability.  Pt is scheduled for additional PT visit but she states she may cancel and not return if her return to the gym goes well..   PT Next Visit Plan pt to return to gym for exercise; she may or may not return to PT depending on gym goes.  We will discharge her within 30 days if she does not return.   Consulted and Agree with Plan of Care Patient        Problem List Patient Active Problem List   Diagnosis Date Noted  . DJD (degenerative joint disease) of knee 01/06/2015    Krisinda Giovanni PT, OCS 06/03/2015, 10:38 AM  [facility] [address]  [place], [place], [address] Phone: [phone]   Fax:  [phone]  Name: Katherine Gilmore MRN: [id number] Date of Birth: October 13, 1970

## 2015-06-17 ENCOUNTER — Ambulatory Visit: Payer: BLUE CROSS/BLUE SHIELD | Admitting: Physical Therapy

## 2015-06-17 DIAGNOSIS — M25661 Stiffness of right knee, not elsewhere classified: Secondary | ICD-10-CM

## 2015-06-17 DIAGNOSIS — R269 Unspecified abnormalities of gait and mobility: Secondary | ICD-10-CM

## 2015-06-17 DIAGNOSIS — R29898 Other symptoms and signs involving the musculoskeletal system: Secondary | ICD-10-CM

## 2015-06-17 NOTE — Therapy (Signed)
[facility] [address]  [place], [place], [address] Phone: [phone]   Fax:  [phone]  Physical Therapy Treatment  Patient Details  Name: Katherine Gilmore MRN: [id number] Date of Birth: 06-08-1971 Referring Provider: Ninetta Lights, MD  Encounter Date: 06/17/2015      PT End of Session - 06/17/15 0942    Visit Number 13   Number of Visits 16   PT Start Time 0935   PT Stop Time 1015   PT Time Calculation (min) 40 min   Activity Tolerance Patient tolerated treatment well   Behavior During Therapy [facility] for tasks assessed/performed      Past Medical History  Diagnosis Date  . Hypertension     not on BP meds currently    Past Surgical History  Procedure Laterality Date  . Breast reduction surgery    . Uterine fibroid surgery    . Cesarean section    . Hernia repair      as a baby  . Abdominal hysterectomy    . Total knee arthroplasty Left 01/06/2015    Procedure: TOTAL KNEE ARTHROPLASTY;  Surgeon: Kathryne Hitch, MD;  Location: [facility];  Service: Orthopedics;  Laterality: Left;  . Total knee arthroplasty Right 03/24/2015    Procedure: TOTAL KNEE ARTHROPLASTY;  Surgeon: Ninetta Lights, MD;  Location: [facility];  Service: Orthopedics;  Laterality: Right;  . I&d extremity Right 03/25/2015    Procedure: FOREIGN BODY REMOVAL RIGHT KNEE;  Surgeon: Renette Butters, MD;  Location: [facility];  Service: Orthopedics;  Laterality: Right;  removal section hemovac drain tubing    There were no vitals filed for this visit.  Visit Diagnosis:  Stiffness of knee joint, right  Weakness of right leg  Abnormality of gait      Subjective Assessment - 06/17/15 0939    Subjective Reports she has attempted some of the exercises at the gym and has not noted any concerns or problems.   Currently in Pain? No/denies  no pain, just stiffness            [facility] PT Assessment - 06/17/15 0935    Assessment   Medical Diagnosis  Right TKR   Onset Date/Surgical Date 03/24/15   Observation/Other Assessments   Focus on Therapeutic Outcomes (FOTO)  Knee 60% (40% limitation)   ROM / Strength   AROM / PROM / Strength AROM;Strength   AROM   AROM Assessment Site Knee   Right/Left Knee Right   Right Knee Extension 3   Right Knee Flexion 119   PROM   PROM Assessment Site Knee   Right/Left Knee Right   Right Knee Extension 0   Right Knee Flexion 120   Strength   Strength Assessment Site Knee;Hip   Right/Left Hip Right   Right Hip Flexion 4/5   Right Hip Extension 4/5   Right Hip ABduction 4+/5   Right Hip ADduction 5/5   Right/Left Knee Right   Right Knee Flexion 4+/5   Right Knee Extension 4+/5           TODAY'S TREATMENT  R Knee ROM, Hip and Knee MMT, gait assessments (refer to assessment and clinical impression)  TherEx   Rec Bike lvl 2-3 5' Prone Knee Flexion Stretch Leg Press 25# R 15x with focus on TKE Standing TKE Black TB 15x3" B Knee Extension Machine 35# 20x B Knee Flexion Machine 35# 20x Bridge 15x Bridge with March 15x  PT Long Term Goals - 06/17/15 1008    PT LONG TERM GOAL #1   Title Independent with HEP (06/17/15)   Status Achieved   PT LONG TERM GOAL #2   Title Right knee AROM 2-120 to allow for normal biomechanics with gait and stair climbing (06/17/15)   Status Partially Met  AROM 2-119, PROM 0-120   PT LONG TERM GOAL #3   Title Demo right LE strength 4/5 or greater to improve function and stability (06/17/15)   Status Achieved   PT LONG TERM GOAL #4   Title Ambulate with normal gait pattern on all surfaces (05/24/15)   Status Achieved   PT LONG TERM GOAL #5   Title Ascend/descend stairs with reciprocal gait pattern (06/17/15)   Status Achieved               Plan - 06/17/15 1039    Clinical Impression Statement Patient has made excellent progress with physical therapy with patient now demonstrating right knee AROM 3-119 & PROM 0-120 with hip  strength 4/5-5/5 and knee 4+/5. Patient able to ambulate with normal gait pattern and ascend/descend stairs with reciprocal pattern without use of railing with improving eccentric control R quads when descending stairs resulting in less pelvic twist to R when lowering L LE. Patient is independent with HEP and reports good carryover of exercises with gym-based workout. Patient pleased with progress and confident with ability to continue progression on her own. All goals met or nearly met for this episode, therefore will proceed with discharge from PT for this episode.   PT Next Visit Plan Discharge   Consulted and Agree with Plan of Care Patient        Problem List Patient Active Problem List   Diagnosis Date Noted  . DJD (degenerative joint disease) of knee 01/06/2015    Percival Spanish, PT, MPT 06/17/2015, 11:12 AM  [facility] [address]  [place], [place], [address] Phone: [phone]   Fax:  [phone]  Name: Katherine Gilmore MRN: [id number] Date of Birth: 08/23/1970   PHYSICAL THERAPY DISCHARGE SUMMARY  Visits from Start of Care: 13  Current functional level related to goals / functional outcomes:  Patient has made excellent progress with physical therapy with patient now demonstrating right knee AROM 3-119 & PROM 0-120 with hip strength 4/5-5/5 and knee 4+/5. Patient able to ambulate with normal gait pattern and ascend/descend stairs with reciprocal pattern without use of railing with improving eccentric control R quads when descending stairs resulting in less pelvic twist to R when lowering L LE. Patient is independent with HEP and reports good carryover of exercises with gym-based workout. Patient pleased with progress and confident with ability to continue progression on her own. All goals met or nearly met for this episode, therefore will proceed with discharge from PT for this episode.   Remaining deficits:   Mild R TKE LOM (3 dg)   Education / Equipment:  HEP & instruction in gym-based exercise program  Plan: Patient agrees to discharge.  Patient goals were met. Patient is being discharged due to being pleased with the current functional level.  ?????       Percival Spanish, PT, MPT 06/17/2015, 11:15 AM  [facility] [address]  [place], [place], [address] Phone: [phone]   Fax:  [phone]

## 2016-06-23 ENCOUNTER — Other Ambulatory Visit: Payer: Self-pay | Admitting: Obstetrics and Gynecology

## 2016-06-23 DIAGNOSIS — Z1231 Encounter for screening mammogram for malignant neoplasm of breast: Secondary | ICD-10-CM

## 2016-06-23 DIAGNOSIS — Z9889 Other specified postprocedural states: Secondary | ICD-10-CM

## 2016-07-03 ENCOUNTER — Inpatient Hospital Stay: Admission: RE | Admit: 2016-07-03 | Payer: BLUE CROSS/BLUE SHIELD | Source: Ambulatory Visit

## 2016-07-21 ENCOUNTER — Ambulatory Visit: Payer: BLUE CROSS/BLUE SHIELD

## 2016-08-02 ENCOUNTER — Ambulatory Visit
Admission: RE | Admit: 2016-08-02 | Discharge: 2016-08-02 | Disposition: A | Discharge status: Home / Self Care | Payer: BLUE CROSS/BLUE SHIELD | Source: Ambulatory Visit | Attending: Obstetrics and Gynecology | Admitting: Obstetrics and Gynecology

## 2016-08-02 DIAGNOSIS — Z9889 Other specified postprocedural states: Secondary | ICD-10-CM

## 2016-08-02 DIAGNOSIS — Z1231 Encounter for screening mammogram for malignant neoplasm of breast: Secondary | ICD-10-CM

## 2017-06-27 ENCOUNTER — Other Ambulatory Visit: Payer: Self-pay | Admitting: Obstetrics and Gynecology

## 2017-06-27 DIAGNOSIS — Z1231 Encounter for screening mammogram for malignant neoplasm of breast: Secondary | ICD-10-CM

## 2017-08-06 ENCOUNTER — Ambulatory Visit
Admission: RE | Admit: 2017-08-06 | Discharge: 2017-08-06 | Disposition: A | Discharge status: Home / Self Care | Payer: BLUE CROSS/BLUE SHIELD | Source: Ambulatory Visit | Attending: Obstetrics and Gynecology | Admitting: Obstetrics and Gynecology

## 2017-08-06 DIAGNOSIS — Z1231 Encounter for screening mammogram for malignant neoplasm of breast: Secondary | ICD-10-CM

## 2018-07-22 ENCOUNTER — Other Ambulatory Visit: Payer: Self-pay | Admitting: Obstetrics and Gynecology

## 2018-07-22 DIAGNOSIS — Z1231 Encounter for screening mammogram for malignant neoplasm of breast: Secondary | ICD-10-CM

## 2018-08-20 ENCOUNTER — Ambulatory Visit
Admission: RE | Admit: 2018-08-20 | Discharge: 2018-08-20 | Disposition: A | Discharge status: Home / Self Care | Payer: Managed Care, Other (non HMO) | Source: Ambulatory Visit | Attending: Obstetrics and Gynecology | Admitting: Obstetrics and Gynecology

## 2018-08-20 DIAGNOSIS — Z1231 Encounter for screening mammogram for malignant neoplasm of breast: Secondary | ICD-10-CM

## 2019-09-04 ENCOUNTER — Other Ambulatory Visit: Payer: Self-pay | Admitting: Obstetrics and Gynecology

## 2019-09-04 DIAGNOSIS — Z1231 Encounter for screening mammogram for malignant neoplasm of breast: Secondary | ICD-10-CM

## 2019-09-22 ENCOUNTER — Ambulatory Visit: Payer: Managed Care, Other (non HMO)

## 2019-09-25 ENCOUNTER — Ambulatory Visit
Admission: RE | Admit: 2019-09-25 | Discharge: 2019-09-25 | Disposition: A | Discharge status: Home / Self Care | Payer: Managed Care, Other (non HMO) | Source: Ambulatory Visit | Attending: Obstetrics and Gynecology | Admitting: Obstetrics and Gynecology

## 2019-09-25 ENCOUNTER — Other Ambulatory Visit: Payer: Self-pay

## 2019-09-25 ENCOUNTER — Inpatient Hospital Stay: Admission: RE | Admit: 2019-09-25 | Payer: Managed Care, Other (non HMO) | Source: Ambulatory Visit

## 2019-09-25 ENCOUNTER — Other Ambulatory Visit: Payer: Self-pay | Admitting: Obstetrics and Gynecology

## 2019-09-25 DIAGNOSIS — Z1231 Encounter for screening mammogram for malignant neoplasm of breast: Secondary | ICD-10-CM

## 2020-10-14 ENCOUNTER — Other Ambulatory Visit: Payer: Self-pay | Admitting: Cardiothoracic Surgery

## 2020-10-14 ENCOUNTER — Other Ambulatory Visit: Payer: Self-pay | Admitting: Family Medicine

## 2020-10-14 DIAGNOSIS — Z1231 Encounter for screening mammogram for malignant neoplasm of breast: Secondary | ICD-10-CM

## 2020-12-03 ENCOUNTER — Ambulatory Visit: Payer: Managed Care, Other (non HMO)

## 2021-07-30 IMAGING — MG DIGITAL SCREENING BILAT W/ TOMO W/ CAD
8 series · 8 of 24 positions shown · non-contrast
Comparison: Previous exam(s).

CLINICAL DATA: Screening.

EXAM:
DIGITAL SCREENING BILATERAL MAMMOGRAM WITH TOMO AND CAD

[L MLO synth-2D]
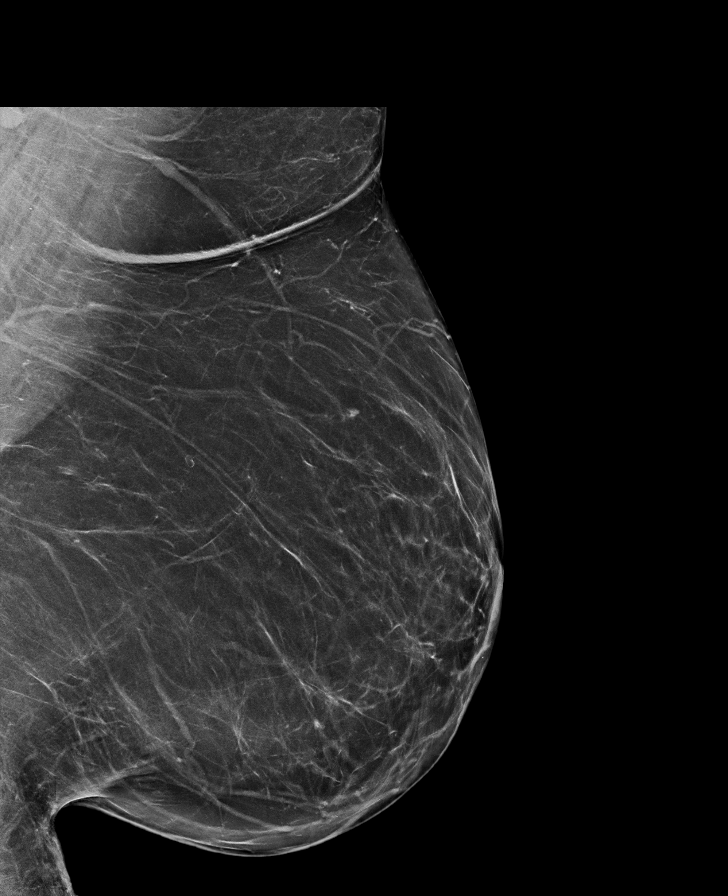

[R MLO synth-2D]
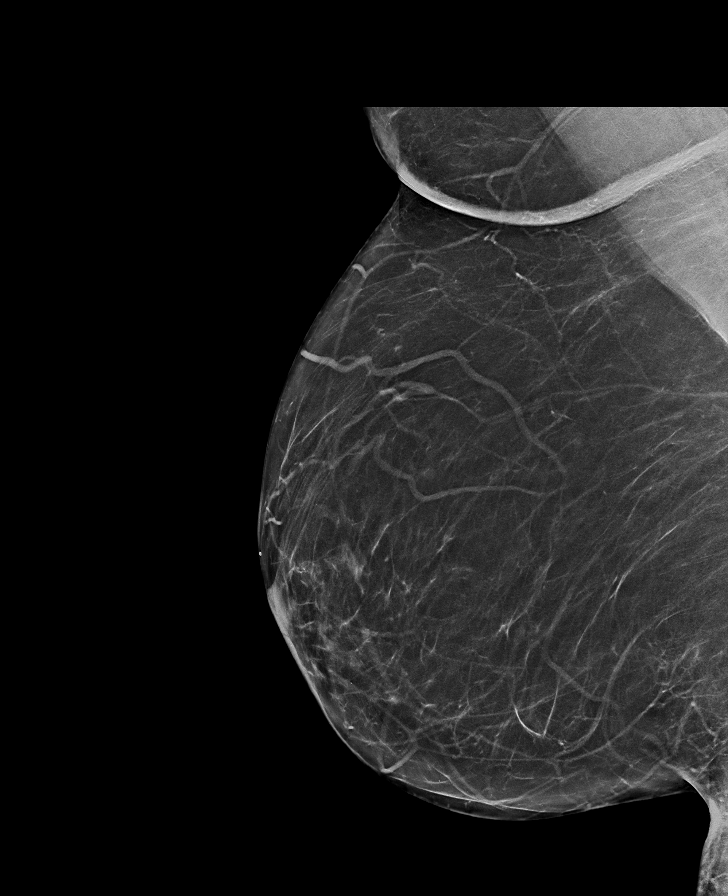

[L CC synth-2D]
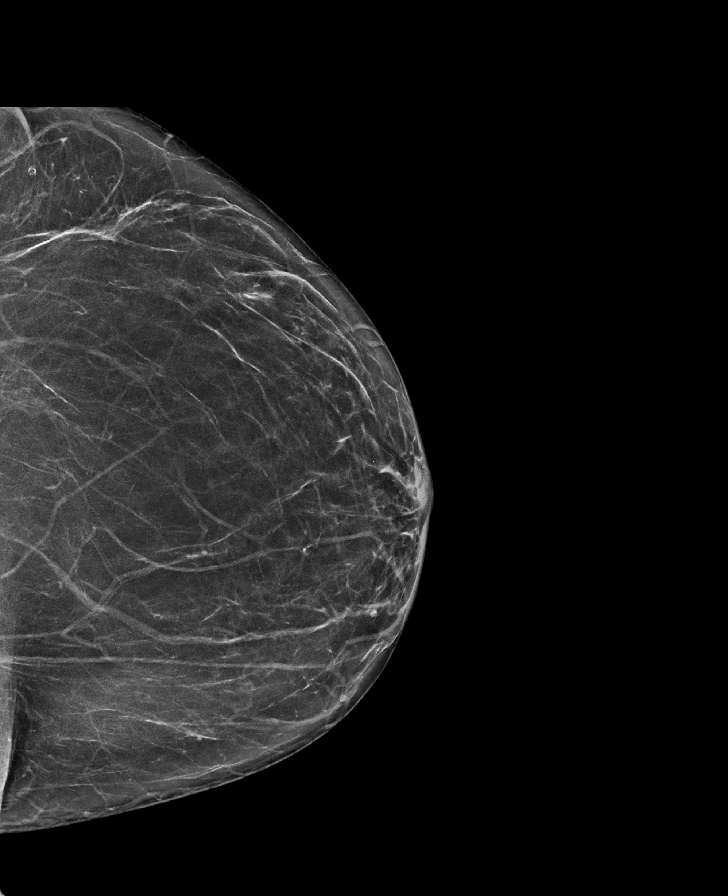

[R CC synth-2D]
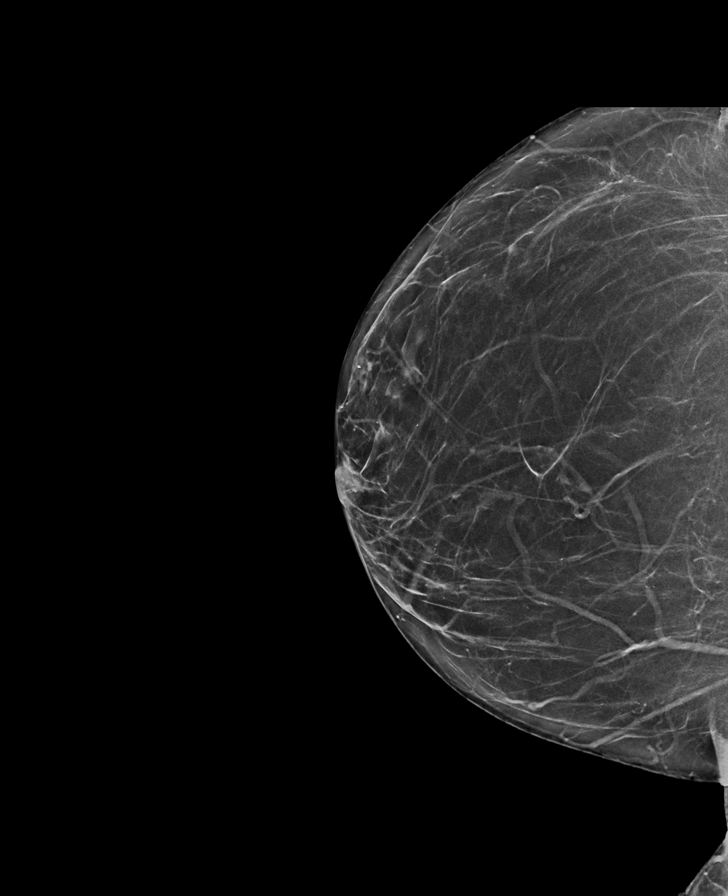

[L MLO tomo · tomo slice 39/77.0]
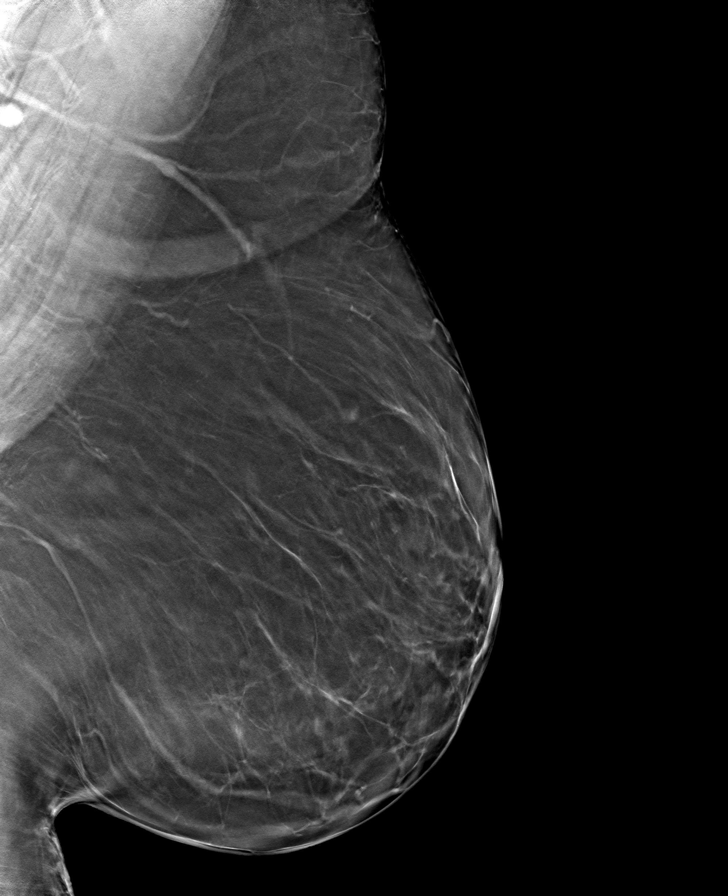

[L CC tomo · tomo slice 35/69.0]
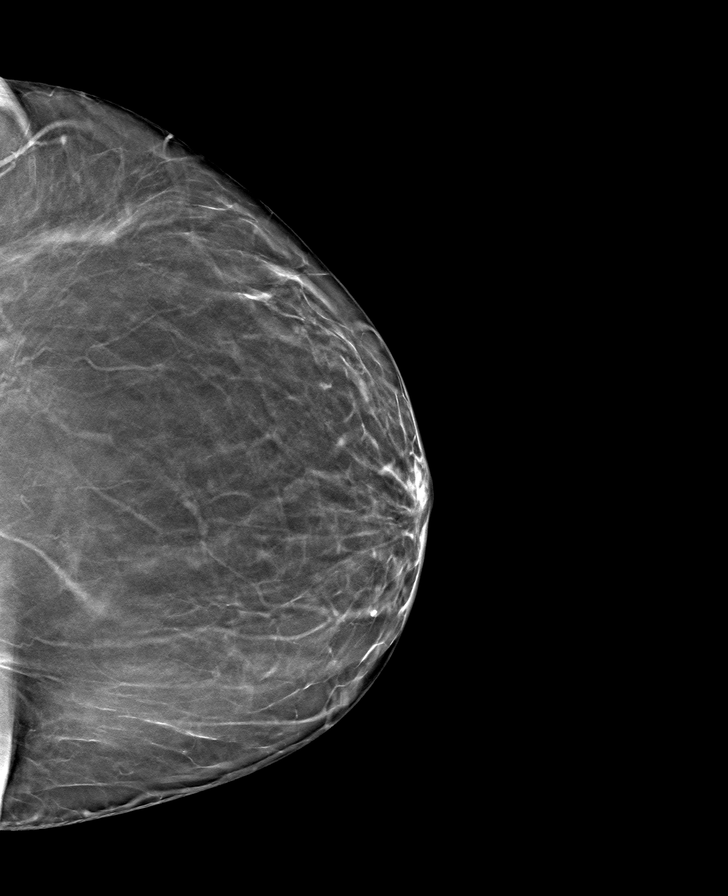

[R MLO tomo · tomo slice 37/74.0]
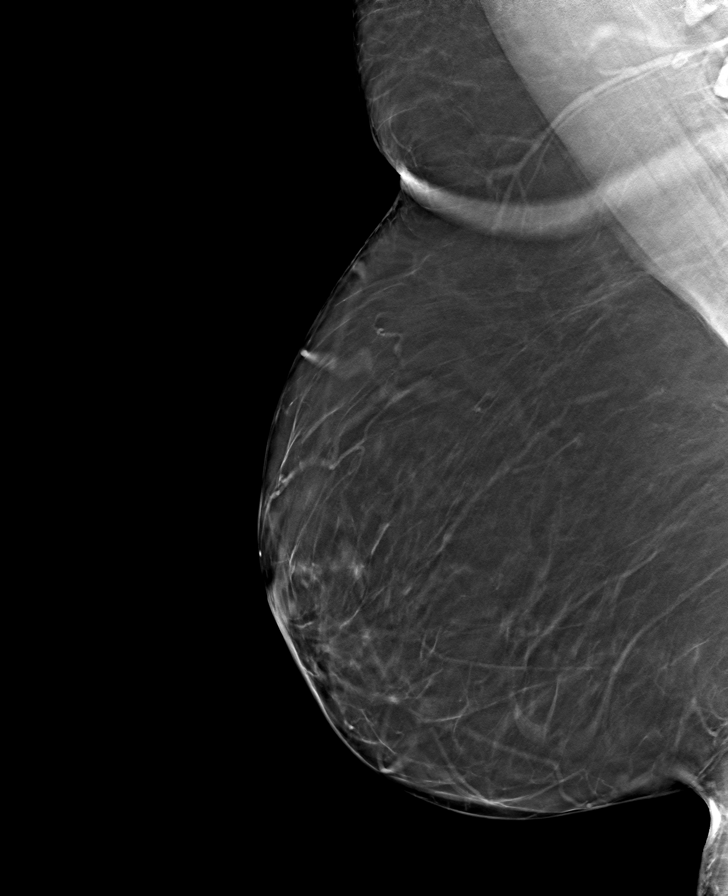

[R CC tomo · tomo slice 33/66.0]
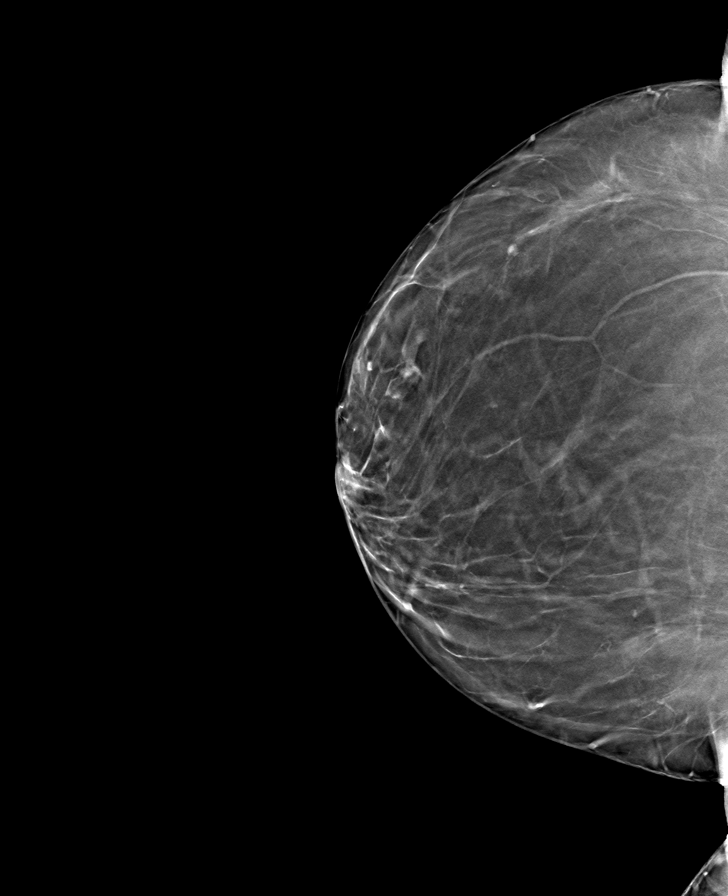

[8 of 24 positions shown; findings below may reference images not displayed]

ACR Breast Density Category b: There are scattered areas of
fibroglandular density.
FINDINGS: There are no findings suspicious for malignancy. Images were
processed with CAD.
IMPRESSION: No mammographic evidence of malignancy. A result letter of this
screening mammogram will be mailed directly to the patient.

RECOMMENDATION:
Screening mammogram in one year. (Code:[id number])

BI-RADS CATEGORY  1: Negative.

## 2021-09-02 ENCOUNTER — Ambulatory Visit (INDEPENDENT_AMBULATORY_CARE_PROVIDER_SITE_OTHER): Payer: 59 | Admitting: Podiatry

## 2021-09-02 ENCOUNTER — Encounter: Payer: Self-pay | Admitting: Podiatry

## 2021-09-02 ENCOUNTER — Other Ambulatory Visit: Payer: Self-pay

## 2021-09-02 DIAGNOSIS — Q828 Other specified congenital malformations of skin: Secondary | ICD-10-CM | POA: Diagnosis not present

## 2021-09-02 NOTE — Progress Notes (Signed)
?  Subjective:  ?Patient ID: Katherine Gilmore, female    DOB: Apr 24, 1971,   MRN: [id number] ? ?Chief Complaint  ?Patient presents with  ? Callouses  ?   ?left foot spot on bottom causing pain  ? ? ?51 y.o. female presents for concern of area on the bottom of her left foot that has been causing pain for months. Has been having it scraped by the salon but keeps coming back.  . Denies any other pedal complaints. Denies n/v/f/c.  ? ?Past Medical History:  ?Diagnosis Date  ? Hypertension   ? not on BP meds currently  ? ? ?Objective:  ?Physical Exam: ?Vascular: DP/PT pulses 2/4 bilateral. CFT <3 seconds. Normal hair growth on digits. No edema.  ?Skin. No lacerations or abrasions bilateral feet. Cored hyperkeratotic lesion noted to lateral aspect of left foot.  ?Musculoskeletal: MMT 5/5 bilateral lower extremities in DF, PF, Inversion and Eversion. Deceased ROM in DF of ankle joint.  ?Neurological: Sensation intact to light touch.  ? ?Assessment:  ? ?1. Porokeratosis   ? ? ? ?Plan:  ?Patient was evaluated and treated and all questions answered. ?-Discussed corns and calluses with patient and treatment options.  ?-Hyperkeratotic tissue was debrided with chisel without incident.  ?-Applied salycylic acid treatment to area with dressing. Advised to remove bandaging tomorrow.  ?-Encouraged daily moisturizing ?-Discussed use of pumice stone ?-Advised good supportive shoes and inserts ?-Patient to return to office as needed or sooner if condition worsens. ? ? ?Louann Sjogren, DPM  ? ? ?
